# Patient Record
Sex: Female | Born: 1950 | Race: White | Hispanic: No | Marital: Married | Smoking: Never smoker
Health system: Southern US, Community
[De-identification: ages and names within clinical notes are randomized; demographics above are authoritative.]

## PROBLEM LIST (undated history)

## (undated) DIAGNOSIS — M1990 Unspecified osteoarthritis, unspecified site: Secondary | ICD-10-CM

## (undated) DIAGNOSIS — K219 Gastro-esophageal reflux disease without esophagitis: Secondary | ICD-10-CM

## (undated) DIAGNOSIS — Z87442 Personal history of urinary calculi: Secondary | ICD-10-CM

## (undated) DIAGNOSIS — R51 Headache: Secondary | ICD-10-CM

## (undated) HISTORY — PX: ABDOMINAL HYSTERECTOMY: SHX81

## (undated) HISTORY — DX: Personal history of urinary calculi: Z87.442

## (undated) HISTORY — DX: Gastro-esophageal reflux disease without esophagitis: K21.9

## (undated) HISTORY — DX: Headache: R51

## (undated) HISTORY — DX: Unspecified osteoarthritis, unspecified site: M19.90

---

## 2003-05-22 ENCOUNTER — Other Ambulatory Visit: Admission: RE | Admit: 2003-05-22 | Discharge: 2003-05-22 | Payer: Self-pay | Admitting: Obstetrics and Gynecology

## 2003-06-06 ENCOUNTER — Encounter: Admission: RE | Admit: 2003-06-06 | Discharge: 2003-06-06 | Payer: Self-pay | Admitting: Obstetrics and Gynecology

## 2003-06-06 ENCOUNTER — Encounter: Payer: Self-pay | Admitting: Obstetrics and Gynecology

## 2006-07-23 ENCOUNTER — Ambulatory Visit: Payer: Self-pay | Admitting: Internal Medicine

## 2006-08-09 ENCOUNTER — Encounter (INDEPENDENT_AMBULATORY_CARE_PROVIDER_SITE_OTHER): Payer: Self-pay | Admitting: *Deleted

## 2006-08-09 ENCOUNTER — Ambulatory Visit (HOSPITAL_COMMUNITY): Admission: RE | Admit: 2006-08-09 | Discharge: 2006-08-09 | Payer: Self-pay | Admitting: Internal Medicine

## 2006-08-13 ENCOUNTER — Ambulatory Visit: Payer: Self-pay | Admitting: Internal Medicine

## 2006-09-14 ENCOUNTER — Ambulatory Visit: Payer: Self-pay | Admitting: Internal Medicine

## 2006-09-14 LAB — HM COLONOSCOPY: HM Colonoscopy: NORMAL

## 2009-02-25 LAB — HM MAMMOGRAPHY: HM Mammogram: NORMAL

## 2010-08-27 ENCOUNTER — Other Ambulatory Visit: Admission: RE | Admit: 2010-08-27 | Discharge: 2010-08-27 | Payer: Self-pay | Admitting: Internal Medicine

## 2010-08-27 ENCOUNTER — Ambulatory Visit: Payer: Self-pay | Admitting: Internal Medicine

## 2010-08-27 DIAGNOSIS — Z87442 Personal history of urinary calculi: Secondary | ICD-10-CM

## 2010-08-27 DIAGNOSIS — K219 Gastro-esophageal reflux disease without esophagitis: Secondary | ICD-10-CM

## 2010-08-27 DIAGNOSIS — M1990 Unspecified osteoarthritis, unspecified site: Secondary | ICD-10-CM | POA: Insufficient documentation

## 2010-08-27 DIAGNOSIS — R51 Headache: Secondary | ICD-10-CM

## 2010-08-27 DIAGNOSIS — R519 Headache, unspecified: Secondary | ICD-10-CM | POA: Insufficient documentation

## 2010-08-27 HISTORY — DX: Personal history of urinary calculi: Z87.442

## 2010-08-27 HISTORY — DX: Gastro-esophageal reflux disease without esophagitis: K21.9

## 2010-08-27 HISTORY — DX: Headache: R51

## 2010-08-27 HISTORY — DX: Unspecified osteoarthritis, unspecified site: M19.90

## 2010-08-27 LAB — CONVERTED CEMR LAB
AST: 23 units/L (ref 0–37)
Basophils Absolute: 0 10*3/uL (ref 0.0–0.1)
Bilirubin, Direct: 0.2 mg/dL (ref 0.0–0.3)
CO2: 32 meq/L (ref 19–32)
Chloride: 106 meq/L (ref 96–112)
Cholesterol: 284 mg/dL — ABNORMAL HIGH (ref 0–200)
Creatinine, Ser: 0.9 mg/dL (ref 0.4–1.2)
Eosinophils Relative: 4 % (ref 0.0–5.0)
Glucose, Bld: 81 mg/dL (ref 70–99)
HCT: 41.4 % (ref 36.0–46.0)
HDL: 48.1 mg/dL (ref >39.00)
Lymphs Abs: 1.7 10*3/uL (ref 0.7–4.0)
Monocytes Relative: 8.1 % (ref 3.0–12.0)
Neutrophils Relative %: 55.1 % (ref 43.0–77.0)
Pap Smear: NEGATIVE
Platelets: 179 10*3/uL (ref 150.0–400.0)
RDW: 14.7 % — ABNORMAL HIGH (ref 11.5–14.6)
Sodium: 143 meq/L (ref 135–145)
TSH: 2.07 microintl units/mL (ref 0.35–5.50)
Total Bilirubin: 0.8 mg/dL (ref 0.3–1.2)
Total CHOL/HDL Ratio: 6
Triglycerides: 117 mg/dL (ref 0.0–149.0)
VLDL: 23.4 mg/dL (ref 0.0–40.0)
WBC: 5.3 10*3/uL (ref 4.5–10.5)

## 2010-08-29 ENCOUNTER — Encounter: Payer: Self-pay | Admitting: Internal Medicine

## 2011-01-13 NOTE — Letter (Signed)
Summary: Results Follow-up Letter  [facility]  [address] [place], [place] [address]   Phone: [phone]  Fax: [phone]    08/29/2010  [address] [place], [place]  [address]  Dear Ms. Mayford Knife,   The following are the results of your recent test(s):  Test     Result     Pap Smear    Normal____XX___  Not Normal_____       Comments:none    _________________________________________________________  Please call for an appointment as directed _________________________________________________________ _________________________________________________________ _________________________________________________________  Sincerely,  Sanda Linger MD [facility]

## 2011-01-13 NOTE — Assessment & Plan Note (Signed)
Summary: NEW/ [facility] /NWS  #   Vital Signs:  Patient profile:   60 year old female Menstrual status:  hysterectomy Height:      66 inches Weight:      172.75 pounds BMI:     27.98 O2 Sat:      95 % on Room air Temp:     98.3 degrees F oral Pulse rate:   64 / minute Pulse rhythm:   regular Resp:     16 per minute BP sitting:   128 / 80  (left arm) Cuff size:   large  Vitals Entered By: Rock Nephew CMA (August 27, 2010 9:04 AM)  Nutrition Counseling: Patient's BMI is greater than 25 and therefore counseled on weight management options.  O2 Flow:  Room air CC: New to establish/ pt c/o  acid reflux and heartburn, Preventive Care, Heartburn Is Patient Diabetic? No Pain Assessment Patient in pain? no       Does patient need assistance? Functional Status Self care Ambulation Normal     Menstrual Status hysterectomy   Primary Care Provider:  Etta Grandchild MD  CC:  New to establish/ pt c/o  acid reflux and heartburn, Preventive Care, and Heartburn.  History of Present Illness:  Heartburn      This is a 60 year old woman who presents with Heartburn.  The symptoms began >1 year ago.  The intensity is described as moderate.  The patient reports acid reflux and weight gain, but denies sour taste in mouth, epigastric pain, chest pain, trouble swallowing, and weight loss.  The patient denies the following alarm features: melena, dysphagia, hematemesis, vomiting, involuntary weight loss >5%, and history of anemia.  Symptoms are worse with spicy foods.  Prior evaluation has included no diagnostic studies.  Treatment that was tried and either found to be ineffective or stopped due to problems include dietary changes, weight loss, elevating the head of bed, an antacid, and an H2 blocker.    Preventive Screening-Counseling & Management  Alcohol-Tobacco     Alcohol drinks/day: 0     Smoking Status: never     Tobacco Counseling: not indicated; no tobacco  use  Caffeine-Diet-Exercise     Does Patient Exercise: yes     Type of exercise: walking     Exercise (avg: min/session): <30     Times/week: 4     Exercise Counseling: to improve exercise regimen  Hep-HIV-STD-Contraception     Hepatitis Risk: no risk noted     HIV Risk: no risk noted     STD Risk: no risk noted     Dental Visit-last 6 months yes     Dental Care Counseling: to seek dental care; no dental care within six months     SBE monthly: yes     SBE Education/Counseling: to perform regular SBE     Sun Exposure-Excessive: no  Safety-Violence-Falls     Seat Belt Use: yes     Helmet Use: yes     Firearms in the Home: no firearms in the home     Smoke Detectors: yes     Sexual Abuse: no      Sexual History:  currently monogamous.        Drug Use:  never and no.        Blood Transfusions:  no.    Medications Prior to Update: 1)  None  Current Medications (verified): 1)  Omeprazole 40 Mg Cpdr (Omeprazole) .... One By Mouth Once Daily  Allergies (  verified): No Known Drug Allergies  Comments:  Nurse/Medical Assistant: The patient's medications and allergies were reviewed with the patient and were updated in the Medication and Allergy Lists. Rock Nephew CMA (August 27, 2010 9:04 AM)  Past History:  Past Medical History: Headache Nephrolithiasis, hx of Osteoarthritis  Past Surgical History: Hysterectomy  Family History: Family History of Alcoholism/Addiction Family History of Arthritis Family History Diabetes 1st degree relative Family History High cholesterol Family History Hypertension Family History Colon Cancer Family History Heart disease  Social History: Occupation: Geographical information systems officer Married Never Smoked Alcohol use-no Drug use-no Regular exercise-yes Smoking Status:  never Drug Use:  never, no Does Patient Exercise:  yes 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  The patient denies anorexia, fever, weight loss, hoarseness, chest pain, syncope, dyspnea on exertion, peripheral edema, prolonged cough, headaches, hemoptysis, abdominal pain, melena, hematochezia, hematuria, incontinence, genital sores, muscle weakness, suspicious skin lesions, difficulty walking, depression, abnormal bleeding, enlarged lymph nodes, angioedema, and breast masses.   GI:  Complains of indigestion; denies abdominal pain, bloody stools, change in bowel habits, constipation, dark tarry stools, diarrhea, gas, loss of appetite, nausea, vomiting, vomiting blood, and yellowish skin color.  Physical Exam  General:  alert, well-developed, well-nourished, well-hydrated, appropriate dress, normal appearance, healthy-appearing, cooperative to examination, good hygiene, and overweight-appearing.   Head:  normocephalic, atraumatic, no abnormalities observed, and no abnormalities palpated.   Eyes:  vision grossly intact, pupils equal, pupils round, and pupils reactive to light.   Mouth:  Oral mucosa and oropharynx without lesions or exudates.  Teeth in good repair. Neck:  supple, full ROM, no masses, no thyromegaly, no thyroid nodules or tenderness, no JVD, normal carotid upstroke, no carotid bruits, no cervical lymphadenopathy, and no neck tenderness.   Chest Wall:  No deformities, masses, or tenderness noted. Breasts:  skin/areolae normal, no masses, no abnormal thickening, no nipple discharge, no tenderness, and no adenopathy.   Lungs:  normal respiratory effort, no intercostal retractions, no accessory muscle use, normal breath sounds, no dullness, no fremitus, no crackles, and no wheezes.   Heart:  normal rate, regular  rhythm, no murmur, no gallop, no rub, and no JVD.   Abdomen:  soft, non-tender, normal bowel sounds, no distention, no masses, no guarding, no rigidity, no rebound tenderness, no abdominal hernia, no inguinal hernia, no hepatomegaly, and no splenomegaly.   Rectal:  No external abnormalities noted. Normal sphincter tone. No rectal masses or tenderness. Genitalia:  normal introitus, no external lesions, no vaginal discharge, mucosa pink and moist, no vaginal or cervical lesions, no vaginal atrophy, no friaility or hemorrhage, and no adnexal masses or tenderness.   Msk:  No deformity or scoliosis noted of thoracic or lumbar spine.   Pulses:  R and L carotid,radial,femoral,dorsalis pedis and posterior tibial pulses are full and equal bilaterally Extremities:  No clubbing, cyanosis, edema, or deformity noted with normal full range of motion of all joints.   Neurologic:  No cranial nerve deficits noted. Station and gait are normal. Plantar reflexes are down-going bilaterally. DTRs are symmetrical throughout. Sensory, motor and coordinative functions appear intact. Skin:  turgor normal, color  normal, no rashes, no suspicious lesions, no ecchymoses, no petechiae, no purpura, no ulcerations, no edema, actinic keratosis, and seborrheic keratosis.   Cervical Nodes:  no anterior cervical adenopathy and no posterior cervical adenopathy.   Axillary Nodes:  no R axillary adenopathy and no L axillary adenopathy.   Inguinal Nodes:  no R inguinal adenopathy and no L inguinal adenopathy.   Psych:  Cognition and judgment appear intact. Alert and cooperative with normal attention span and concentration. No apparent delusions, illusions, hallucinations   Impression & Recommendations:  Problem # 1:  ROUTINE GENERAL MEDICAL EXAM@HEALTH  CARE FACL (ICD-V70.0) Assessment New  Orders: Venipuncture ([address]) TLB-Lipid Panel (80061-LIPID) TLB-CBC Platelet - w/Differential (85025-CBCD) TLB-Hepatic/Liver Function Pnl  (80076-HEPATIC) TLB-TSH (Thyroid Stimulating Hormone) (84443-TSH) TLB-BMP (Basic Metabolic Panel-BMET) (80048-METABOL) Radiology Referral (Radiology) Hemoccult Guaiac-1 spec.(in office) ([address])  Mammogram: Normal Bilateral (02/25/2009) Colonoscopy: Normal (09/14/2006)  Discussed using sunscreen, use of alcohol, drug use, self breast exam, routine dental care, routine eye care, schedule for GYN exam, routine physical exam, seat belts, multiple vitamins, osteoporosis prevention, adequate calcium intake in diet, recommendations for immunizations, mammograms and Pap smears.  Discussed exercise and checking cholesterol.   Problem # 2:  GERD (ICD-530.81) Assessment: New  Her updated medication list for this problem includes:    Omeprazole 40 Mg Cpdr (Omeprazole) ..... One by mouth once daily  Orders: Venipuncture ([address]) TLB-Lipid Panel (80061-LIPID) TLB-CBC Platelet - w/Differential (85025-CBCD) TLB-Hepatic/Liver Function Pnl (80076-HEPATIC) TLB-TSH (Thyroid Stimulating Hormone) (84443-TSH) TLB-BMP (Basic Metabolic Panel-BMET) (80048-METABOL) Hemoccult Guaiac-1 spec.(in office) (82270)  Complete Medication List: 1)  Omeprazole 40 Mg Cpdr (Omeprazole) .... One by mouth once daily  Other Orders: Tdap => 107yrs IM (91478) Admin 1st Vaccine ([address]) Specimen Handling ([address])  Colorectal Screening:  Current Recommendations:    Hemoccult: NEG X 1 today  Colonoscopy Results:    Date of Exam: 09/14/2006    Results: Normal  PAP Screening:    Hx Cervical Dysplasia in last 5 yrs? No    3 normal PAP smears in last 5 yrs? Yes    Reviewed PAP smear recommendations:  PAP smear done  Mammogram Screening:    Last Mammogram:  02/25/2009    Reviewed Mammogram recommendations:  mammogram ordered  Mammogram Results:    Date of Exam:  02/25/2009    Results:  Normal Bilateral  Osteoporosis Risk Assessment:  Risk Factors for Fracture or Low Bone Density:   Race (White or Asian):     yes    Smoking status:       never  Immunization & Chemoprophylaxis:    Tetanus vaccine: Tdap  (08/27/2010)  Patient Instructions: 1)  Please schedule a follow-up appointment in 3 months. 2)  Avoid foods high in acid (tomatoes, citrus juices, spicy foods). Avoid eating within two hours of lying down or before exercising. Do not over eat; try smaller more frequent meals. Elevate head of bed twelve inches when sleeping. 3)  It is important that you exercise regularly at least 20 minutes 5 times a week. If you develop chest pain, have severe difficulty breathing, or feel very tired , stop exercising immediately and seek medical attention. 4)  You need to lose weight. Consider a lower calorie diet and regular exercise.  5)  Schedule your mammogram. Prescriptions: OMEPRAZOLE 40 MG CPDR (OMEPRAZOLE) One by mouth once daily  #30 x 11   Entered and Authorized by:   Etta Grandchild MD   Signed by:   Etta Grandchild MD on 08/27/2010   Method used:  Print then Give to Patient   RxID:   [id number]     Immunizations Administered:  Tetanus Vaccine:    Vaccine Type: Tdap    Site: right deltoid    Mfr: GlaxoSmithKline    Dose: 0.5 ml    Route: IM    Given by: Rock Nephew CMA    Exp. Date: 09/03/2012    Lot #: [id number]    VIS given: 10/31/08 version given August 27, 2010.

## 2011-01-13 NOTE — Letter (Signed)
Summary: Lipid Letter  [facility]  [address] [place], [place] [address]   Phone: [phone]  Fax: [phone]    08/27/2010  Kristena Wilhelmi [address] [place], [place]  [address]  Dear Ms. Selders:  We have carefully reviewed your last lipid profile from  and the results are noted below with a summary of recommendations for lipid management.    Cholesterol:       284     Goal: <200   HDL "good" Cholesterol:   06.30     Goal: >50   LDL "bad" Cholesterol:   213     Goal: <130 WOW- this is high   Triglycerides:       117.0     Goal: <150        TLC Diet (Therapeutic Lifestyle Change): Saturated Fats & Transfatty acids should be kept < 7% of total calories ***Reduce Saturated Fats Polyunstaurated Fat can be up to 10% of total calories Monounsaturated Fat Fat can be up to 20% of total calories Total Fat should be no greater than 25-35% of total calories Carbohydrates should be 50-60% of total calories Protein should be approximately 15% of total calories Fiber should be at least 20-30 grams a day ***Increased fiber may help lower LDL Total Cholesterol should be < 200mg /day Consider adding plant stanol/sterols to diet (example: Benacol spread) ***A higher intake of unsaturated fat may reduce Triglycerides and Increase HDL    Adjunctive Measures (may lower LIPIDS and reduce risk of Heart Attack) include: Aerobic Exercise (20-30 minutes 3-4 times a week) Limit Alcohol Consumption Weight Reduction Aspirin 75-81 mg a day by mouth (if not allergic or contraindicated) Dietary Fiber 20-30 grams a day by mouth     Current Medications: 1)    Omeprazole 40 Mg Cpdr (Omeprazole) .... One by mouth once daily  If you have any questions, please call. We appreciate being able to work with you.   Sincerely,    [facility] Etta Grandchild MD

## 2011-05-01 NOTE — Assessment & Plan Note (Signed)
[facility]                           GASTROENTEROLOGY OFFICE NOTE   NAME:WILLIAMSConcepcion, Gillott                      MRN:          [id number]  DATE:09/14/2006                            DOB:          [birth year]    Ms. Lehtinen returns for followup.  She had some sort of illness where she  had a severe diarrhea and cramps and a thickened ileum.  A colonoscopy was  normal and she is now well.  I had her come back just to make sure things  were okay.  There does not seem to be any post infectious irritable bowel  syndrome.  Her terminal ileum biopsies were negative.  At this point, I  assumed she had some sort of infectious enteritis problem that has resolved.  She will see me as needed.       Iva Boop, MD,FACG      CEG/MedQ  DD:  09/14/2006  DT:  09/15/2006  Job #:  [id number]   cc:   Jonita Albee, M.D.

## 2011-05-01 NOTE — Assessment & Plan Note (Signed)
[facility]                           GASTROENTEROLOGY OFFICE NOTE   NAME:WILLIAMSKalisha, Keadle                      MRN:          [id number]  DATE:07/23/2006                            DOB:          12/30/1950    REQUESTING PHYSICIAN:  Dr. Robert Bellow.   REASON FOR CONSULTATION:  Abnormal CT scan with abdominal pain and diarrhea,  question regional enteritis.   ASSESSMENT:  This is a 60 year old white woman with a thickened distal ilium  on CT scan in the setting of acute abdominal pain and diarrhea.  She is  actually much improved at this point, the symptoms started approximately  five days ago.  Crohn's disease or regional enteritis is definitely in the  differential, but given the acute onset and rapid recovery, I am more  suspicious of an acute infectious enteritis.  However, she did have a  similar but much more limited spell in July, so chronic problems like  Crohn's disease or perhaps recurrent ischemia of the intestines are  certainly possible and will need to be concerned.   PLAN:  1. Schedule colonoscopy.  2. If the colonoscopy is unremarkable, would observe, need to review the      films further as well, consider whether she needs a CT angiogram.  3. If she does have signs of Crohn's disease would probably re-institute      Asacol (I have asked her to stop it) versus other therapy, she should      continue her courses of Cipro and Flagyl at this time.  4. Risks, benefits, and indications of colonoscopy have been explained to      the patient.  She understands and agrees.   HISTORY:  This is a 60 year old white woman with problems as above.  Nobody  else has been sick.  There has been no foreign travel.  She did have a  similar spell in July only lasted for 2 days.  She is much better.  At this  time.  She did have a fever when this started as well.  That has abated  also.  Laboratory investigation is pending as far as stool studies  which  were collected at [facility].  I do have a CMET,  amylase, and lipase which were normal.  She did have a total cholesterol of  262.   MEDICATIONS:  1. Ciprofloxacin 500 mg b.i.d.  2. Hydrocodone/APAP q.4 hours as needed (not really using).  3. Metronidazole 500 mg b.i.d.  4. Asacol 400 mg b.i.d.  5. Motrin and Tums are used as needed.   ALLERGIES:  There are no known drug allergies.   PAST MEDICAL HISTORY:  1. Nephrolithiasis 1998.  2. Hysterectomy 1991.   FAMILY HISTORY:  Positive for diabetes, alcoholism.  No colon cancer or  inflammatory bowel disease.   SOCIAL HISTORY:  She is married.  She is an Nature conservation officer at [employer].  Two sons, one daughter.  Two of those are still at home.  Occasional  alcohol.  No drugs.   REVIEW OF SYSTEMS:  As above.  Some occasional night  sweats.  Some tinnitus  in the past.  Eyeglasses are used.  All other symptoms negative.   PHYSICAL EXAMINATION:  GENERAL:  This is a well-developed, well-nourished,  middle-aged white woman in no acute distress.  VITALS:  Height 5 feet 5 inches.  Weight 156 pounds.  Blood pressure 124/84.  Pulse 60.  EYES:  Anicteric.  __________  .  NECK:  Supple, no masses.  CHEST:  Clear.  HEART:  S1, S2. No murmurs or gallops.  ABDOMEN:  Mildly tender in the right lower quadrant with deep palpation.  I  detect no mass or organomegaly or other problems.  EXTREMITIES:  No edema.  SKIN:  No rash.  NEURO:  She is alert and oriented x3.   I appreciate the opportunity to care for this patient.                                   Iva Boop, MD, Clementeen Graham   CEG/MedQ  DD:  07/24/2006  DT:  07/24/2006  Job #:  [id number]   cc:   Jonita Albee, MD  Juluis Mire, MD

## 2011-09-20 ENCOUNTER — Other Ambulatory Visit: Payer: Self-pay | Admitting: Internal Medicine

## 2011-11-26 ENCOUNTER — Encounter: Payer: Self-pay | Admitting: Internal Medicine

## 2011-11-26 ENCOUNTER — Other Ambulatory Visit (INDEPENDENT_AMBULATORY_CARE_PROVIDER_SITE_OTHER): Payer: 59

## 2011-11-26 ENCOUNTER — Other Ambulatory Visit: Payer: Self-pay | Admitting: Internal Medicine

## 2011-11-26 ENCOUNTER — Ambulatory Visit (INDEPENDENT_AMBULATORY_CARE_PROVIDER_SITE_OTHER): Payer: 59 | Admitting: Internal Medicine

## 2011-11-26 VITALS — BP 136/80 | HR 66 | Temp 97.1°F | Resp 16 | Wt 165.0 lb

## 2011-11-26 DIAGNOSIS — Z23 Encounter for immunization: Secondary | ICD-10-CM

## 2011-11-26 DIAGNOSIS — K219 Gastro-esophageal reflux disease without esophagitis: Secondary | ICD-10-CM

## 2011-11-26 DIAGNOSIS — Z0000 Encounter for general adult medical examination without abnormal findings: Secondary | ICD-10-CM

## 2011-11-26 LAB — CBC WITH DIFFERENTIAL/PLATELET
Basophils Absolute: 0 10*3/uL (ref 0.0–0.1)
Basophils Relative: 0.6 % (ref 0.0–3.0)
Eosinophils Relative: 1.6 % (ref 0.0–5.0)
Hemoglobin: 13.3 g/dL (ref 12.0–15.0)
Lymphocytes Relative: 32.3 % (ref 12.0–46.0)
Monocytes Relative: 10 % (ref 3.0–12.0)
Neutro Abs: 3.1 10*3/uL (ref 1.4–7.7)
RBC: 4.79 Mil/uL (ref 3.87–5.11)
RDW: 14.6 % (ref 11.5–14.6)
WBC: 5.5 10*3/uL (ref 4.5–10.5)

## 2011-11-26 LAB — TSH: TSH: 2.32 u[IU]/mL (ref 0.35–5.50)

## 2011-11-26 LAB — COMPREHENSIVE METABOLIC PANEL
ALT: 19 U/L (ref 0–35)
CO2: 29 mEq/L (ref 19–32)
Calcium: 9.7 mg/dL (ref 8.4–10.5)
Chloride: 106 mEq/L (ref 96–112)
Creatinine, Ser: 0.9 mg/dL (ref 0.4–1.2)
GFR: 65.08 mL/min (ref >60.00)
Glucose, Bld: 86 mg/dL (ref 70–99)

## 2011-11-26 LAB — LIPID PANEL
Total CHOL/HDL Ratio: 5
VLDL: 23.4 mg/dL (ref 0.0–40.0)

## 2011-11-26 LAB — LDL CHOLESTEROL, DIRECT: Direct LDL: 189.8 mg/dL

## 2011-11-26 LAB — URINALYSIS, ROUTINE W REFLEX MICROSCOPIC
Hgb urine dipstick: NEGATIVE
Ketones, ur: NEGATIVE
Urine Glucose: NEGATIVE
Urobilinogen, UA: 0.2 (ref 0.0–1.0)

## 2011-11-26 MED ORDER — OMEPRAZOLE 40 MG PO CPDR: 40.0000 mg | DELAYED_RELEASE_CAPSULE | Freq: Every day | ORAL | Status: DC

## 2011-11-26 NOTE — Patient Instructions (Signed)

## 2011-11-26 NOTE — Progress Notes (Signed)
  Subjective:    Patient ID: Julia Olson, female    DOB: Jan 29, 1951, 60 y.o.   MRN: [id number]  HPI  New to me for a complete physical, she offers no complaints.  Review of Systems  Constitutional: Negative.   HENT: Negative.   Eyes: Negative.   Respiratory: Negative.   Cardiovascular: Negative.   Gastrointestinal: Negative.   Genitourinary: Negative.   Musculoskeletal: Negative.   Skin: Negative.   Neurological: Negative.   Hematological: Negative.   Psychiatric/Behavioral: Negative.        Objective:   Physical Exam  Vitals reviewed. Constitutional: She is oriented to person, place, and time. She appears well-developed and well-nourished. No distress.  HENT:  Head: Normocephalic and atraumatic.  Mouth/Throat: Oropharynx is clear and moist. No oropharyngeal exudate.  Eyes: Conjunctivae are normal. Right eye exhibits no discharge. Left eye exhibits no discharge. No scleral icterus.  Neck: Normal range of motion. Neck supple. No JVD present. No tracheal deviation present. No thyromegaly present.  Cardiovascular: Normal rate, regular rhythm, normal heart sounds and intact distal pulses.  Exam reveals no gallop and no friction rub.   No murmur heard. Pulmonary/Chest: Effort normal and breath sounds normal. No stridor. No respiratory distress. She has no wheezes. She has no rales. Chest wall is not dull to percussion. She exhibits no mass, no tenderness, no bony tenderness, no laceration, no crepitus, no edema, no deformity, no swelling and no retraction. Right breast exhibits no inverted nipple, no mass, no nipple discharge, no skin change and no tenderness. Left breast exhibits no inverted nipple, no mass, no nipple discharge, no skin change and no tenderness. Breasts are symmetrical.  Abdominal: Soft. Bowel sounds are normal. She exhibits no distension. There is no tenderness. There is no rebound and no guarding.  Musculoskeletal: Normal range of motion. She exhibits no edema and  no tenderness.  Lymphadenopathy:    She has no cervical adenopathy.  Neurological: She is oriented to person, place, and time.  Skin: Skin is warm and dry. No rash noted. She is not diaphoretic. No erythema. No pallor.  Psychiatric: She has a normal mood and affect. Her behavior is normal. Judgment and thought content normal.      Lab Results  Component Value Date   WBC 5.3 08/27/2010   HGB 14.1 08/27/2010   HCT 41.4 08/27/2010   PLT 179.0 08/27/2010   GLUCOSE 81 08/27/2010   CHOL 284* 08/27/2010   TRIG 117.0 08/27/2010   HDL 48.10 08/27/2010   LDLDIRECT 212.5 08/27/2010   ALT 20 08/27/2010   AST 23 08/27/2010   NA 143 08/27/2010   K 4.9 08/27/2010   CL 106 08/27/2010   CREATININE 0.9 08/27/2010   BUN 14 08/27/2010   CO2 32 08/27/2010   TSH 2.07 08/27/2010      Assessment & Plan:

## 2011-11-26 NOTE — Assessment & Plan Note (Signed)
Exam done, vaccines updated, labs ordered, pt ed material was given 

## 2012-10-14 ENCOUNTER — Telehealth: Payer: Self-pay

## 2012-10-14 MED ORDER — OMEPRAZOLE 20 MG PO CPDR: 40.0000 mg | DELAYED_RELEASE_CAPSULE | Freq: Every day | ORAL | Status: DC

## 2012-10-14 NOTE — Telephone Encounter (Signed)
Patient called [facility] stating that omperazole 40 mg has increased from 10$ to 100$. SHe is requesting RX for the 20mg  instead to be sent to [facility] .

## 2013-02-07 ENCOUNTER — Other Ambulatory Visit: Payer: Self-pay | Admitting: Internal Medicine

## 2014-02-18 ENCOUNTER — Other Ambulatory Visit: Payer: Self-pay | Admitting: Internal Medicine

## 2015-03-05 ENCOUNTER — Telehealth: Payer: Self-pay | Admitting: Internal Medicine

## 2015-03-05 NOTE — Telephone Encounter (Signed)
yes

## 2015-03-05 NOTE — Telephone Encounter (Signed)
Patient is requesting to reestablish

## 2015-03-07 NOTE — Telephone Encounter (Signed)
Got appointment scheduled

## 2015-03-15 ENCOUNTER — Encounter: Payer: Self-pay | Admitting: Internal Medicine

## 2015-03-15 ENCOUNTER — Other Ambulatory Visit (INDEPENDENT_AMBULATORY_CARE_PROVIDER_SITE_OTHER): Payer: 59

## 2015-03-15 ENCOUNTER — Ambulatory Visit (INDEPENDENT_AMBULATORY_CARE_PROVIDER_SITE_OTHER): Payer: 59 | Admitting: Internal Medicine

## 2015-03-15 VITALS — BP 142/82 | HR 64 | Temp 98.1°F | Resp 16 | Ht 65.0 in | Wt 151.0 lb

## 2015-03-15 DIAGNOSIS — Z0000 Encounter for general adult medical examination without abnormal findings: Secondary | ICD-10-CM

## 2015-03-15 DIAGNOSIS — K219 Gastro-esophageal reflux disease without esophagitis: Secondary | ICD-10-CM

## 2015-03-15 DIAGNOSIS — Z1231 Encounter for screening mammogram for malignant neoplasm of breast: Secondary | ICD-10-CM

## 2015-03-15 LAB — COMPREHENSIVE METABOLIC PANEL
ALBUMIN: 4.2 g/dL (ref 3.5–5.2)
ALT: 17 U/L (ref 0–35)
AST: 20 U/L (ref 0–37)
Alkaline Phosphatase: 65 U/L (ref 39–117)
BUN: 12 mg/dL (ref 6–23)
CO2: 31 meq/L (ref 19–32)
Calcium: 9.7 mg/dL (ref 8.4–10.5)
Chloride: 103 mEq/L (ref 96–112)
Creatinine, Ser: 0.79 mg/dL (ref 0.40–1.20)
GFR: 77.73 mL/min (ref >60.00)
GLUCOSE: 80 mg/dL (ref 70–99)
POTASSIUM: 3.9 meq/L (ref 3.5–5.1)
SODIUM: 139 meq/L (ref 135–145)
TOTAL PROTEIN: 7 g/dL (ref 6.0–8.3)
Total Bilirubin: 0.4 mg/dL (ref 0.2–1.2)

## 2015-03-15 LAB — LIPID PANEL
CHOLESTEROL: 214 mg/dL — AB (ref 0–200)
HDL: 54.3 mg/dL (ref >39.00)
LDL Cholesterol: 142 mg/dL — ABNORMAL HIGH (ref 0–99)
NonHDL: 159.7
Total CHOL/HDL Ratio: 4
Triglycerides: 91 mg/dL (ref 0.0–149.0)
VLDL: 18.2 mg/dL (ref 0.0–40.0)

## 2015-03-15 LAB — CBC WITH DIFFERENTIAL/PLATELET
BASOS ABS: 0 10*3/uL (ref 0.0–0.1)
Basophils Relative: 0.5 % (ref 0.0–3.0)
Eosinophils Absolute: 0.2 10*3/uL (ref 0.0–0.7)
Eosinophils Relative: 2.4 % (ref 0.0–5.0)
HCT: 35.3 % — ABNORMAL LOW (ref 36.0–46.0)
Hemoglobin: 11.2 g/dL — ABNORMAL LOW (ref 12.0–15.0)
Lymphocytes Relative: 23.5 % (ref 12.0–46.0)
Lymphs Abs: 1.9 10*3/uL (ref 0.7–4.0)
MCHC: 31.6 g/dL (ref 30.0–36.0)
MCV: 70.6 fl — ABNORMAL LOW (ref 78.0–100.0)
MONOS PCT: 7.2 % (ref 3.0–12.0)
Monocytes Absolute: 0.6 10*3/uL (ref 0.1–1.0)
NEUTROS ABS: 5.3 10*3/uL (ref 1.4–7.7)
NEUTROS PCT: 66.4 % (ref 43.0–77.0)
Platelets: 226 10*3/uL (ref 150.0–400.0)
RBC: 5.01 Mil/uL (ref 3.87–5.11)
RDW: 17.1 % — ABNORMAL HIGH (ref 11.5–15.5)
WBC: 8 10*3/uL (ref 4.0–10.5)

## 2015-03-15 LAB — TSH: TSH: 2.21 u[IU]/mL (ref 0.35–4.50)

## 2015-03-15 MED ORDER — OMEPRAZOLE 20 MG PO CPDR: 40.0000 mg | DELAYED_RELEASE_CAPSULE | Freq: Every day | ORAL | Status: DC

## 2015-03-15 NOTE — Patient Instructions (Signed)
Preventive Care for Adults A healthy lifestyle and preventive care can promote health and wellness. Preventive health guidelines for women include the following key practices.  A routine yearly physical is a good way to check with your health care provider about your health and preventive screening. It is a chance to share any concerns and updates on your health and to receive a thorough exam.  Visit your dentist for a routine exam and preventive care every 6 months. Brush your teeth twice a day and floss once a day. Good oral hygiene prevents tooth decay and gum disease.  The frequency of eye exams is based on your age, health, family medical history, use of contact lenses, and other factors. Follow your health care provider's recommendations for frequency of eye exams.  Eat a healthy diet. Foods like vegetables, fruits, whole grains, low-fat dairy products, and lean protein foods contain the nutrients you need without too many calories. Decrease your intake of foods high in solid fats, added sugars, and salt. Eat the right amount of calories for you.Get information about a proper diet from your health care provider, if necessary.  Regular physical exercise is one of the most important things you can do for your health. Most adults should get at least 150 minutes of moderate-intensity exercise (any activity that increases your heart rate and causes you to sweat) each week. In addition, most adults need muscle-strengthening exercises on 2 or more days a week.  Maintain a healthy weight. The body mass index (BMI) is a screening tool to identify possible weight problems. It provides an estimate of body fat based on height and weight. Your health care provider can find your BMI and can help you achieve or maintain a healthy weight.For adults 20 years and older:  A BMI below 18.5 is considered underweight.  A BMI of 18.5 to 24.9 is normal.  A BMI of 25 to 29.9 is considered overweight.  A BMI of  30 and above is considered obese.  Maintain normal blood lipids and cholesterol levels by exercising and minimizing your intake of saturated fat. Eat a balanced diet with plenty of fruit and vegetables. Blood tests for lipids and cholesterol should begin at age 76 and be repeated every 5 years. If your lipid or cholesterol levels are high, you are over 50, or you are at high risk for heart disease, you may need your cholesterol levels checked more frequently.Ongoing high lipid and cholesterol levels should be treated with medicines if diet and exercise are not working.  If you smoke, find out from your health care provider how to quit. If you do not use tobacco, do not start.  Lung cancer screening is recommended for adults aged 22-80 years who are at high risk for developing lung cancer because of a history of smoking. A yearly low-dose CT scan of the lungs is recommended for people who have at least a 30-pack-year history of smoking and are a current smoker or have quit within the past 15 years. A pack year of smoking is smoking an average of 1 pack of cigarettes a day for 1 year (for example: 1 pack a day for 30 years or 2 packs a day for 15 years). Yearly screening should continue until the smoker has stopped smoking for at least 15 years. Yearly screening should be stopped for people who develop a health problem that would prevent them from having lung cancer treatment.  If you are pregnant, do not drink alcohol. If you are breastfeeding,  be very cautious about drinking alcohol. If you are not pregnant and choose to drink alcohol, do not have more than 1 drink per day. One drink is considered to be 12 ounces (355 mL) of beer, 5 ounces (148 mL) of wine, or 1.5 ounces (44 mL) of liquor.  Avoid use of street drugs. Do not share needles with anyone. Ask for help if you need support or instructions about stopping the use of drugs.  High blood pressure causes heart disease and increases the risk of  stroke. Your blood pressure should be checked at least every 1 to 2 years. Ongoing high blood pressure should be treated with medicines if weight loss and exercise do not work.  If you are 75-52 years old, ask your health care provider if you should take aspirin to prevent strokes.  Diabetes screening involves taking a blood sample to check your fasting blood sugar level. This should be done once every 3 years, after age 15, if you are within normal weight and without risk factors for diabetes. Testing should be considered at a younger age or be carried out more frequently if you are overweight and have at least 1 risk factor for diabetes.  Breast cancer screening is essential preventive care for women. You should practice "breast self-awareness." This means understanding the normal appearance and feel of your breasts and may include breast self-examination. Any changes detected, no matter how small, should be reported to a health care provider. Women in their 58s and 30s should have a clinical breast exam (CBE) by a health care provider as part of a regular health exam every 1 to 3 years. After age 16, women should have a CBE every year. Starting at age 53, women should consider having a mammogram (breast X-ray test) every year. Women who have a family history of breast cancer should talk to their health care provider about genetic screening. Women at a high risk of breast cancer should talk to their health care providers about having an MRI and a mammogram every year.  Breast cancer gene (BRCA)-related cancer risk assessment is recommended for women who have family members with BRCA-related cancers. BRCA-related cancers include breast, ovarian, tubal, and peritoneal cancers. Having family members with these cancers may be associated with an increased risk for harmful changes (mutations) in the breast cancer genes BRCA1 and BRCA2. Results of the assessment will determine the need for genetic counseling and  BRCA1 and BRCA2 testing.  Routine pelvic exams to screen for cancer are no longer recommended for nonpregnant women who are considered low risk for cancer of the pelvic organs (ovaries, uterus, and vagina) and who do not have symptoms. Ask your health care provider if a screening pelvic exam is right for you.  If you have had past treatment for cervical cancer or a condition that could lead to cancer, you need Pap tests and screening for cancer for at least 20 years after your treatment. If Pap tests have been discontinued, your risk factors (such as having a new sexual partner) need to be reassessed to determine if screening should be resumed. Some women have medical problems that increase the chance of getting cervical cancer. In these cases, your health care provider may recommend more frequent screening and Pap tests.  The HPV test is an additional test that may be used for cervical cancer screening. The HPV test looks for the virus that can cause the cell changes on the cervix. The cells collected during the Pap test can be  tested for HPV. The HPV test could be used to screen women aged 30 years and older, and should be used in women of any age who have unclear Pap test results. After the age of 30, women should have HPV testing at the same frequency as a Pap test.  Colorectal cancer can be detected and often prevented. Most routine colorectal cancer screening begins at the age of 50 years and continues through age 75 years. However, your health care provider may recommend screening at an earlier age if you have risk factors for colon cancer. On a yearly basis, your health care provider may provide home test kits to check for hidden blood in the stool. Use of a small camera at the end of a tube, to directly examine the colon (sigmoidoscopy or colonoscopy), can detect the earliest forms of colorectal cancer. Talk to your health care provider about this at age 50, when routine screening begins. Direct  exam of the colon should be repeated every 5-10 years through age 75 years, unless early forms of pre-cancerous polyps or small growths are found.  People who are at an increased risk for hepatitis B should be screened for this virus. You are considered at high risk for hepatitis B if:  You were born in a country where hepatitis B occurs often. Talk with your health care provider about which countries are considered high risk.  Your parents were born in a high-risk country and you have not received a shot to protect against hepatitis B (hepatitis B vaccine).  You have HIV or AIDS.  You use needles to inject street drugs.  You live with, or have sex with, someone who has hepatitis B.  You get hemodialysis treatment.  You take certain medicines for conditions like cancer, organ transplantation, and autoimmune conditions.  Hepatitis C blood testing is recommended for all people born from 1945 through 1965 and any individual with known risks for hepatitis C.  Practice safe sex. Use condoms and avoid high-risk sexual practices to reduce the spread of sexually transmitted infections (STIs). STIs include gonorrhea, chlamydia, syphilis, trichomonas, herpes, HPV, and human immunodeficiency virus (HIV). Herpes, HIV, and HPV are viral illnesses that have no cure. They can result in disability, cancer, and death.  You should be screened for sexually transmitted illnesses (STIs) including gonorrhea and chlamydia if:  You are sexually active and are younger than 24 years.  You are older than 24 years and your health care provider tells you that you are at risk for this type of infection.  Your sexual activity has changed since you were last screened and you are at an increased risk for chlamydia or gonorrhea. Ask your health care provider if you are at risk.  If you are at risk of being infected with HIV, it is recommended that you take a prescription medicine daily to prevent HIV infection. This is  called preexposure prophylaxis (PrEP). You are considered at risk if:  You are a heterosexual woman, are sexually active, and are at increased risk for HIV infection.  You take drugs by injection.  You are sexually active with a partner who has HIV.  Talk with your health care provider about whether you are at high risk of being infected with HIV. If you choose to begin PrEP, you should first be tested for HIV. You should then be tested every 3 months for as long as you are taking PrEP.  Osteoporosis is a disease in which the bones lose minerals and strength   with aging. This can result in serious bone fractures or breaks. The risk of osteoporosis can be identified using a bone density scan. Women ages 65 years and over and women at risk for fractures or osteoporosis should discuss screening with their health care providers. Ask your health care provider whether you should take a calcium supplement or vitamin D to reduce the rate of osteoporosis.  Menopause can be associated with physical symptoms and risks. Hormone replacement therapy is available to decrease symptoms and risks. You should talk to your health care provider about whether hormone replacement therapy is right for you.  Use sunscreen. Apply sunscreen liberally and repeatedly throughout the day. You should seek shade when your shadow is shorter than you. Protect yourself by wearing long sleeves, pants, a wide-brimmed hat, and sunglasses year round, whenever you are outdoors.  Once a month, do a whole body skin exam, using a mirror to look at the skin on your back. Tell your health care provider of new moles, moles that have irregular borders, moles that are larger than a pencil eraser, or moles that have changed in shape or color.  Stay current with required vaccines (immunizations).  Influenza vaccine. All adults should be immunized every year.  Tetanus, diphtheria, and acellular pertussis (Td, Tdap) vaccine. Pregnant women should  receive 1 dose of Tdap vaccine during each pregnancy. The dose should be obtained regardless of the length of time since the last dose. Immunization is preferred during the 27th-36th week of gestation. An adult who has not previously received Tdap or who does not know her vaccine status should receive 1 dose of Tdap. This initial dose should be followed by tetanus and diphtheria toxoids (Td) booster doses every 10 years. Adults with an unknown or incomplete history of completing a 3-dose immunization series with Td-containing vaccines should begin or complete a primary immunization series including a Tdap dose. Adults should receive a Td booster every 10 years.  Varicella vaccine. An adult without evidence of immunity to varicella should receive 2 doses or a second dose if she has previously received 1 dose. Pregnant females who do not have evidence of immunity should receive the first dose after pregnancy. This first dose should be obtained before leaving the health care facility. The second dose should be obtained 4-8 weeks after the first dose.  Human papillomavirus (HPV) vaccine. Females aged 13-26 years who have not received the vaccine previously should obtain the 3-dose series. The vaccine is not recommended for use in pregnant females. However, pregnancy testing is not needed before receiving a dose. If a female is found to be pregnant after receiving a dose, no treatment is needed. In that case, the remaining doses should be delayed until after the pregnancy. Immunization is recommended for any person with an immunocompromised condition through the age of 26 years if she did not get any or all doses earlier. During the 3-dose series, the second dose should be obtained 4-8 weeks after the first dose. The third dose should be obtained 24 weeks after the first dose and 16 weeks after the second dose.  Zoster vaccine. One dose is recommended for adults aged 60 years or older unless certain conditions are  present.  Measles, mumps, and rubella (MMR) vaccine. Adults born before 1957 generally are considered immune to measles and mumps. Adults born in 1957 or later should have 1 or more doses of MMR vaccine unless there is a contraindication to the vaccine or there is laboratory evidence of immunity to   each of the three diseases. A routine second dose of MMR vaccine should be obtained at least 28 days after the first dose for students attending postsecondary schools, health care workers, or international travelers. People who received inactivated measles vaccine or an unknown type of measles vaccine during 1963-1967 should receive 2 doses of MMR vaccine. People who received inactivated mumps vaccine or an unknown type of mumps vaccine before 1979 and are at high risk for mumps infection should consider immunization with 2 doses of MMR vaccine. For females of childbearing age, rubella immunity should be determined. If there is no evidence of immunity, females who are not pregnant should be vaccinated. If there is no evidence of immunity, females who are pregnant should delay immunization until after pregnancy. Unvaccinated health care workers born before 1957 who lack laboratory evidence of measles, mumps, or rubella immunity or laboratory confirmation of disease should consider measles and mumps immunization with 2 doses of MMR vaccine or rubella immunization with 1 dose of MMR vaccine.  Pneumococcal 13-valent conjugate (PCV13) vaccine. When indicated, a person who is uncertain of her immunization history and has no record of immunization should receive the PCV13 vaccine. An adult aged 19 years or older who has certain medical conditions and has not been previously immunized should receive 1 dose of PCV13 vaccine. This PCV13 should be followed with a dose of pneumococcal polysaccharide (PPSV23) vaccine. The PPSV23 vaccine dose should be obtained at least 8 weeks after the dose of PCV13 vaccine. An adult aged 19  years or older who has certain medical conditions and previously received 1 or more doses of PPSV23 vaccine should receive 1 dose of PCV13. The PCV13 vaccine dose should be obtained 1 or more years after the last PPSV23 vaccine dose.  Pneumococcal polysaccharide (PPSV23) vaccine. When PCV13 is also indicated, PCV13 should be obtained first. All adults aged 65 years and older should be immunized. An adult younger than age 65 years who has certain medical conditions should be immunized. Any person who resides in a nursing home or long-term care facility should be immunized. An adult smoker should be immunized. People with an immunocompromised condition and certain other conditions should receive both PCV13 and PPSV23 vaccines. People with human immunodeficiency virus (HIV) infection should be immunized as soon as possible after diagnosis. Immunization during chemotherapy or radiation therapy should be avoided. Routine use of PPSV23 vaccine is not recommended for American Indians, [place] Natives, or people younger than 65 years unless there are medical conditions that require PPSV23 vaccine. When indicated, people who have unknown immunization and have no record of immunization should receive PPSV23 vaccine. One-time revaccination 5 years after the first dose of PPSV23 is recommended for people aged 19-64 years who have chronic kidney failure, nephrotic syndrome, asplenia, or immunocompromised conditions. People who received 1-2 doses of PPSV23 before age 65 years should receive another dose of PPSV23 vaccine at age 65 years or later if at least 5 years have passed since the previous dose. Doses of PPSV23 are not needed for people immunized with PPSV23 at or after age 65 years.  Meningococcal vaccine. Adults with asplenia or persistent complement component deficiencies should receive 2 doses of quadrivalent meningococcal conjugate (MenACWY-D) vaccine. The doses should be obtained at least 2 months apart.  Microbiologists working with certain meningococcal bacteria, military recruits, people at risk during an outbreak, and people who travel to or live in countries with a high rate of meningitis should be immunized. A first-year college student up through age   21 years who is living in a residence hall should receive a dose if she did not receive a dose on or after her 16th birthday. Adults who have certain high-risk conditions should receive one or more doses of vaccine.  Hepatitis A vaccine. Adults who wish to be protected from this disease, have certain high-risk conditions, work with hepatitis A-infected animals, work in hepatitis A research labs, or travel to or work in countries with a high rate of hepatitis A should be immunized. Adults who were previously unvaccinated and who anticipate close contact with an international adoptee during the first 60 days after arrival in the [place] from a country with a high rate of hepatitis A should be immunized.  Hepatitis B vaccine. Adults who wish to be protected from this disease, have certain high-risk conditions, may be exposed to blood or other infectious body fluids, are household contacts or sex partners of hepatitis B positive people, are clients or workers in certain care facilities, or travel to or work in countries with a high rate of hepatitis B should be immunized.  Haemophilus influenzae type b (Hib) vaccine. A previously unvaccinated person with asplenia or sickle cell disease or having a scheduled splenectomy should receive 1 dose of Hib vaccine. Regardless of previous immunization, a recipient of a hematopoietic stem cell transplant should receive a 3-dose series 6-12 months after her successful transplant. Hib vaccine is not recommended for adults with HIV infection. Preventive Services / Frequency Ages 64 to 68 years  Blood pressure check.** / Every 1 to 2 years.  Lipid and cholesterol check.** / Every 5 years beginning at age  22.  Clinical breast exam.** / Every 3 years for women in their 88s and 53s.  BRCA-related cancer risk assessment.** / For women who have family members with a BRCA-related cancer (breast, ovarian, tubal, or peritoneal cancers).  Pap test.** / Every 2 years from ages 90 through 51. Every 3 years starting at age 21 through age 56 or 3 with a history of 3 consecutive normal Pap tests.  HPV screening.** / Every 3 years from ages 24 through ages 1 to 46 with a history of 3 consecutive normal Pap tests.  Hepatitis C blood test.** / For any individual with known risks for hepatitis C.  Skin self-exam. / Monthly.  Influenza vaccine. / Every year.  Tetanus, diphtheria, and acellular pertussis (Tdap, Td) vaccine.** / Consult your health care provider. Pregnant women should receive 1 dose of Tdap vaccine during each pregnancy. 1 dose of Td every 10 years.  Varicella vaccine.** / Consult your health care provider. Pregnant females who do not have evidence of immunity should receive the first dose after pregnancy.  HPV vaccine. / 3 doses over 6 months, if 72 and younger. The vaccine is not recommended for use in pregnant females. However, pregnancy testing is not needed before receiving a dose.  Measles, mumps, rubella (MMR) vaccine.** / You need at least 1 dose of MMR if you were born in 1957 or later. You may also need a 2nd dose. For females of childbearing age, rubella immunity should be determined. If there is no evidence of immunity, females who are not pregnant should be vaccinated. If there is no evidence of immunity, females who are pregnant should delay immunization until after pregnancy.  Pneumococcal 13-valent conjugate (PCV13) vaccine.** / Consult your health care provider.  Pneumococcal polysaccharide (PPSV23) vaccine.** / 1 to 2 doses if you smoke cigarettes or if you have certain conditions.  Meningococcal vaccine.** /  1 dose if you are age 19 to 21 years and a first-year college  student living in a residence hall, or have one of several medical conditions, you need to get vaccinated against meningococcal disease. You may also need additional booster doses.  Hepatitis A vaccine.** / Consult your health care provider.  Hepatitis B vaccine.** / Consult your health care provider.  Haemophilus influenzae type b (Hib) vaccine.** / Consult your health care provider. Ages 40 to 64 years  Blood pressure check.** / Every 1 to 2 years.  Lipid and cholesterol check.** / Every 5 years beginning at age 20 years.  Lung cancer screening. / Every year if you are aged 55-80 years and have a 30-pack-year history of smoking and currently smoke or have quit within the past 15 years. Yearly screening is stopped once you have quit smoking for at least 15 years or develop a health problem that would prevent you from having lung cancer treatment.  Clinical breast exam.** / Every year after age 40 years.  BRCA-related cancer risk assessment.** / For women who have family members with a BRCA-related cancer (breast, ovarian, tubal, or peritoneal cancers).  Mammogram.** / Every year beginning at age 40 years and continuing for as long as you are in good health. Consult with your health care provider.  Pap test.** / Every 3 years starting at age 30 years through age 65 or 70 years with a history of 3 consecutive normal Pap tests.  HPV screening.** / Every 3 years from ages 30 years through ages 65 to 70 years with a history of 3 consecutive normal Pap tests.  Fecal occult blood test (FOBT) of stool. / Every year beginning at age 50 years and continuing until age 75 years. You may not need to do this test if you get a colonoscopy every 10 years.  Flexible sigmoidoscopy or colonoscopy.** / Every 5 years for a flexible sigmoidoscopy or every 10 years for a colonoscopy beginning at age 50 years and continuing until age 75 years.  Hepatitis C blood test.** / For all people born from 1945 through  1965 and any individual with known risks for hepatitis C.  Skin self-exam. / Monthly.  Influenza vaccine. / Every year.  Tetanus, diphtheria, and acellular pertussis (Tdap/Td) vaccine.** / Consult your health care provider. Pregnant women should receive 1 dose of Tdap vaccine during each pregnancy. 1 dose of Td every 10 years.  Varicella vaccine.** / Consult your health care provider. Pregnant females who do not have evidence of immunity should receive the first dose after pregnancy.  Zoster vaccine.** / 1 dose for adults aged 60 years or older.  Measles, mumps, rubella (MMR) vaccine.** / You need at least 1 dose of MMR if you were born in 1957 or later. You may also need a 2nd dose. For females of childbearing age, rubella immunity should be determined. If there is no evidence of immunity, females who are not pregnant should be vaccinated. If there is no evidence of immunity, females who are pregnant should delay immunization until after pregnancy.  Pneumococcal 13-valent conjugate (PCV13) vaccine.** / Consult your health care provider.  Pneumococcal polysaccharide (PPSV23) vaccine.** / 1 to 2 doses if you smoke cigarettes or if you have certain conditions.  Meningococcal vaccine.** / Consult your health care provider.  Hepatitis A vaccine.** / Consult your health care provider.  Hepatitis B vaccine.** / Consult your health care provider.  Haemophilus influenzae type b (Hib) vaccine.** / Consult your health care provider. Ages 65   years and over  Blood pressure check.** / Every 1 to 2 years.  Lipid and cholesterol check.** / Every 5 years beginning at age 22 years.  Lung cancer screening. / Every year if you are aged 73-80 years and have a 30-pack-year history of smoking and currently smoke or have quit within the past 15 years. Yearly screening is stopped once you have quit smoking for at least 15 years or develop a health problem that would prevent you from having lung cancer  treatment.  Clinical breast exam.** / Every year after age 4 years.  BRCA-related cancer risk assessment.** / For women who have family members with a BRCA-related cancer (breast, ovarian, tubal, or peritoneal cancers).  Mammogram.** / Every year beginning at age 40 years and continuing for as long as you are in good health. Consult with your health care provider.  Pap test.** / Every 3 years starting at age 9 years through age 34 or 91 years with 3 consecutive normal Pap tests. Testing can be stopped between 65 and 70 years with 3 consecutive normal Pap tests and no abnormal Pap or HPV tests in the past 10 years.  HPV screening.** / Every 3 years from ages 57 years through ages 64 or 45 years with a history of 3 consecutive normal Pap tests. Testing can be stopped between 65 and 70 years with 3 consecutive normal Pap tests and no abnormal Pap or HPV tests in the past 10 years.  Fecal occult blood test (FOBT) of stool. / Every year beginning at age 15 years and continuing until age 17 years. You may not need to do this test if you get a colonoscopy every 10 years.  Flexible sigmoidoscopy or colonoscopy.** / Every 5 years for a flexible sigmoidoscopy or every 10 years for a colonoscopy beginning at age 86 years and continuing until age 71 years.  Hepatitis C blood test.** / For all people born from [birth year] through 1966 and any individual with known risks for hepatitis C.  Osteoporosis screening.** / A one-time screening for women ages 83 years and over and women at risk for fractures or osteoporosis.  Skin self-exam. / Monthly.  Influenza vaccine. / Every year.  Tetanus, diphtheria, and acellular pertussis (Tdap/Td) vaccine.** / 1 dose of Td every 10 years.  Varicella vaccine.** / Consult your health care provider.  Zoster vaccine.** / 1 dose for adults aged 61 years or older.  Pneumococcal 13-valent conjugate (PCV13) vaccine.** / Consult your health care provider.  Pneumococcal  polysaccharide (PPSV23) vaccine.** / 1 dose for all adults aged 28 years and older.  Meningococcal vaccine.** / Consult your health care provider.  Hepatitis A vaccine.** / Consult your health care provider.  Hepatitis B vaccine.** / Consult your health care provider.  Haemophilus influenzae type b (Hib) vaccine.** / Consult your health care provider. ** Family history and personal history of risk and conditions may change your health care provider's recommendations. Document Released: 01/26/2002 Document Revised: 04/16/2014 Document Reviewed: 04/27/2011 [facility] Patient Information 2015 [place], [place]. This information is not intended to replace advice given to you by your health care provider. Make sure you discuss any questions you have with your health care provider.

## 2015-03-15 NOTE — Progress Notes (Signed)
Pre visit review using our clinic review tool, if applicable. No additional management support is needed unless otherwise documented below in the visit note. 

## 2015-03-17 DIAGNOSIS — Z1231 Encounter for screening mammogram for malignant neoplasm of breast: Secondary | ICD-10-CM | POA: Insufficient documentation

## 2015-03-17 NOTE — Progress Notes (Signed)
   Subjective:    Patient ID: Julia Olson, female    DOB: 07/16/1951, 64 y.o.   MRN: [id number]  Gastrophageal Reflux She reports no abdominal pain, no belching, no chest pain, no choking, no coughing, no dysphagia, no early satiety, no globus sensation, no heartburn, no hoarse voice, no nausea, no sore throat, no stridor, no tooth decay, no water brash or no wheezing. This is a chronic problem. The current episode started more than 1 year ago. The problem occurs rarely. The problem has been unchanged. Nothing aggravates the symptoms. Pertinent negatives include no anemia, fatigue, melena, muscle weakness, orthopnea or weight loss. She has tried a PPI for the symptoms.      Review of Systems  Constitutional: Negative for fever, chills, weight loss, diaphoresis, appetite change and fatigue.  HENT: Negative.  Negative for hoarse voice and sore throat.   Eyes: Negative.   Respiratory: Negative.  Negative for cough, choking, shortness of breath, wheezing and stridor.   Cardiovascular: Negative.  Negative for chest pain, palpitations and leg swelling.  Gastrointestinal: Negative.  Negative for heartburn, dysphagia, nausea, vomiting, abdominal pain, constipation, blood in stool and melena.  Endocrine: Negative.   Genitourinary: Negative.   Musculoskeletal: Negative.  Negative for myalgias, back pain, gait problem, muscle weakness and neck pain.  Skin: Negative.  Negative for rash.  Allergic/Immunologic: Negative.   Neurological: Negative.  Negative for dizziness.  Hematological: Negative.  Negative for adenopathy. Does not bruise/bleed easily.  Psychiatric/Behavioral: Negative.        Objective:   Physical Exam  Constitutional: She is oriented to person, place, and time. She appears well-developed and well-nourished. No distress.  HENT:  Head: Normocephalic and atraumatic.  Mouth/Throat: Oropharynx is clear and moist. No oropharyngeal exudate.  Eyes: Conjunctivae are normal. Right eye  exhibits no discharge. Left eye exhibits no discharge. No scleral icterus.  Neck: Normal range of motion. Neck supple. No JVD present. No tracheal deviation present. No thyromegaly present.  Cardiovascular: Normal rate, regular rhythm, normal heart sounds and intact distal pulses.  Exam reveals no gallop and no friction rub.   No murmur heard. Pulmonary/Chest: Effort normal and breath sounds normal. No stridor. No respiratory distress. She has no wheezes. She has no rales. She exhibits no tenderness.  Abdominal: Soft. Bowel sounds are normal. She exhibits no distension and no mass. There is no tenderness. There is no rebound and no guarding.  Musculoskeletal: Normal range of motion. She exhibits no edema or tenderness.  Lymphadenopathy:    She has no cervical adenopathy.  Neurological: She is oriented to person, place, and time.  Skin: Skin is warm and dry. No rash noted. She is not diaphoretic. No erythema. No pallor.  Psychiatric: She has a normal mood and affect. Her behavior is normal. Judgment and thought content normal.  Vitals reviewed.    Lab Results  Component Value Date   WBC 8.0 03/15/2015   HGB 11.2* 03/15/2015   HCT 35.3* 03/15/2015   PLT 226.0 03/15/2015   GLUCOSE 80 03/15/2015   CHOL 214* 03/15/2015   TRIG 91.0 03/15/2015   HDL 54.30 03/15/2015   LDLDIRECT 189.8 11/26/2011   LDLCALC 142* 03/15/2015   ALT 17 03/15/2015   AST 20 03/15/2015   NA 139 03/15/2015   K 3.9 03/15/2015   CL 103 03/15/2015   CREATININE 0.79 03/15/2015   BUN 12 03/15/2015   CO2 31 03/15/2015   TSH 2.21 03/15/2015       Assessment & Plan:

## 2015-03-17 NOTE — Assessment & Plan Note (Signed)
Vaccines were reviewed and updated Exam done Labs ordered She was referred for a mammogram Pt ed material was given

## 2015-03-17 NOTE — Assessment & Plan Note (Signed)
She is doing well on the PPI Will continue

## 2015-10-29 ENCOUNTER — Encounter: Payer: Self-pay | Admitting: Internal Medicine

## 2015-10-29 ENCOUNTER — Other Ambulatory Visit (INDEPENDENT_AMBULATORY_CARE_PROVIDER_SITE_OTHER): Payer: 59

## 2015-10-29 ENCOUNTER — Ambulatory Visit (INDEPENDENT_AMBULATORY_CARE_PROVIDER_SITE_OTHER): Payer: 59 | Admitting: Internal Medicine

## 2015-10-29 VITALS — BP 158/80 | HR 67 | Temp 97.9°F | Resp 16 | Ht 65.0 in | Wt 159.0 lb

## 2015-10-29 DIAGNOSIS — Z1159 Encounter for screening for other viral diseases: Secondary | ICD-10-CM

## 2015-10-29 DIAGNOSIS — D509 Iron deficiency anemia, unspecified: Secondary | ICD-10-CM

## 2015-10-29 DIAGNOSIS — K219 Gastro-esophageal reflux disease without esophagitis: Secondary | ICD-10-CM

## 2015-10-29 DIAGNOSIS — Z1211 Encounter for screening for malignant neoplasm of colon: Secondary | ICD-10-CM | POA: Insufficient documentation

## 2015-10-29 DIAGNOSIS — D510 Vitamin B12 deficiency anemia due to intrinsic factor deficiency: Secondary | ICD-10-CM | POA: Insufficient documentation

## 2015-10-29 DIAGNOSIS — Z0000 Encounter for general adult medical examination without abnormal findings: Secondary | ICD-10-CM

## 2015-10-29 DIAGNOSIS — Z1231 Encounter for screening mammogram for malignant neoplasm of breast: Secondary | ICD-10-CM

## 2015-10-29 LAB — IBC PANEL
Iron: 28 ug/dL — ABNORMAL LOW (ref 42–145)
SATURATION RATIOS: 5.6 % — AB (ref 20.0–50.0)
TRANSFERRIN: 356 mg/dL (ref 212.0–360.0)

## 2015-10-29 LAB — CBC WITH DIFFERENTIAL/PLATELET
BASOS ABS: 0 10*3/uL (ref 0.0–0.1)
Basophils Relative: 0.6 % (ref 0.0–3.0)
Eosinophils Absolute: 0.2 10*3/uL (ref 0.0–0.7)
Eosinophils Relative: 3.4 % (ref 0.0–5.0)
HCT: 35.3 % — ABNORMAL LOW (ref 36.0–46.0)
Hemoglobin: 11.1 g/dL — ABNORMAL LOW (ref 12.0–15.0)
LYMPHS ABS: 2.3 10*3/uL (ref 0.7–4.0)
Lymphocytes Relative: 32.9 % (ref 12.0–46.0)
MCHC: 31.3 g/dL (ref 30.0–36.0)
MCV: 74.7 fl — ABNORMAL LOW (ref 78.0–100.0)
MONO ABS: 0.6 10*3/uL (ref 0.1–1.0)
MONOS PCT: 8.3 % (ref 3.0–12.0)
NEUTROS PCT: 54.8 % (ref 43.0–77.0)
Neutro Abs: 3.9 10*3/uL (ref 1.4–7.7)
Platelets: 218 10*3/uL (ref 150.0–400.0)
RBC: 4.73 Mil/uL (ref 3.87–5.11)
RDW: 16.3 % — ABNORMAL HIGH (ref 11.5–15.5)
WBC: 7.1 10*3/uL (ref 4.0–10.5)

## 2015-10-29 LAB — FOLATE: Folate: >23.8 ng/mL (ref >5.9)

## 2015-10-29 LAB — VITAMIN B12: VITAMIN B 12: 374 pg/mL (ref 211–911)

## 2015-10-29 LAB — FERRITIN: FERRITIN: 3 ng/mL — AB (ref 10.0–291.0)

## 2015-10-29 MED ORDER — OMEPRAZOLE 20 MG PO CPDR: 40.0000 mg | DELAYED_RELEASE_CAPSULE | Freq: Every day | ORAL | Status: DC

## 2015-10-29 NOTE — Progress Notes (Signed)
Pre visit review using our clinic review tool, if applicable. No additional management support is needed unless otherwise documented below in the visit note. 

## 2015-10-29 NOTE — Patient Instructions (Signed)
Anemia, Nonspecific Anemia is a condition in which the concentration of red blood cells or hemoglobin in the blood is below normal. Hemoglobin is a substance in red blood cells that carries oxygen to the tissues of the body. Anemia results in not enough oxygen reaching these tissues.  CAUSES  Common causes of anemia include:   Excessive bleeding. Bleeding may be internal or external. This includes excessive bleeding from periods (in women) or from the intestine.   Poor nutrition.   Chronic kidney, thyroid, and liver disease.  Bone marrow disorders that decrease red blood cell production.  Cancer and treatments for cancer.  HIV, AIDS, and their treatments.  Spleen problems that increase red blood cell destruction.  Blood disorders.  Excess destruction of red blood cells due to infection, medicines, and autoimmune disorders. SIGNS AND SYMPTOMS   Minor weakness.   Dizziness.   Headache.  Palpitations.   Shortness of breath, especially with exercise.   Paleness.  Cold sensitivity.  Indigestion.  Nausea.  Difficulty sleeping.  Difficulty concentrating. Symptoms may occur suddenly or they may develop slowly.  DIAGNOSIS  Additional blood tests are often needed. These help your health care provider determine the best treatment. Your health care provider will check your stool for blood and look for other causes of blood loss.  TREATMENT  Treatment varies depending on the cause of the anemia. Treatment can include:   Supplements of iron, vitamin B12, or folic acid.   Hormone medicines.   A blood transfusion. This may be needed if blood loss is severe.   Hospitalization. This may be needed if there is significant continual blood loss.   Dietary changes.  Spleen removal. HOME CARE INSTRUCTIONS Keep all follow-up appointments. It often takes many weeks to correct anemia, and having your health care provider check on your condition and your response to  treatment is very important. SEEK IMMEDIATE MEDICAL CARE IF:   You develop extreme weakness, shortness of breath, or chest pain.   You become dizzy or have trouble concentrating.  You develop heavy vaginal bleeding.   You develop a rash.   You have bloody or black, tarry stools.   You faint.   You vomit up blood.   You vomit repeatedly.   You have abdominal pain.  You have a fever or persistent symptoms for more than 2-3 days.   You have a fever and your symptoms suddenly get worse.   You are dehydrated.  MAKE SURE YOU:  Understand these instructions.  Will watch your condition.  Will get help right away if you are not doing well or get worse.   This information is not intended to replace advice given to you by your health care provider. Make sure you discuss any questions you have with your health care provider.   Document Released: 01/07/2005 Document Revised: 08/02/2013 Document Reviewed: 05/26/2013 Elsevier Interactive Patient Education 2016 [employer]  

## 2015-10-29 NOTE — Progress Notes (Signed)
Subjective:  Patient ID: Julia Olson, female    DOB: June 18, 1951  Age: 64 y.o. MRN: [id number]  CC: Anemia; Gastroesophageal Reflux; and Annual Exam   HPI Julia Olson presents for a complete physical and follow-up on anemia. She feels well and offers no complaints. She is not aware of any sources of blood loss. She recently had lab work done by her employer, [employer], and she was told that the results were okay but she does not have any results with her today.  Outpatient Prescriptions Prior to Visit  Medication Sig Dispense Refill  . omeprazole (PRILOSEC) 20 MG capsule Take 2 capsules (40 mg total) by mouth daily. 180 capsule 3   No facility-administered medications prior to visit.    ROS Review of Systems  Constitutional: Negative.  Negative for fever, chills, diaphoresis, appetite change and fatigue.  HENT: Negative.  Negative for trouble swallowing and voice change.   Eyes: Negative.   Respiratory: Negative.  Negative for cough, choking, chest tightness, shortness of breath and stridor.   Cardiovascular: Negative.  Negative for chest pain, palpitations and leg swelling.  Gastrointestinal: Negative.  Negative for nausea, vomiting, abdominal pain, diarrhea, constipation and blood in stool.  Endocrine: Negative.   Genitourinary: Negative.  Negative for dysuria, urgency, hematuria, flank pain and difficulty urinating.  Musculoskeletal: Negative.  Negative for myalgias, back pain, joint swelling and arthralgias.  Skin: Negative.  Negative for color change and rash.  Allergic/Immunologic: Negative.   Neurological: Negative.  Negative for dizziness, tremors, seizures, facial asymmetry, weakness, numbness and headaches.  Hematological: Negative.   Psychiatric/Behavioral: Negative.     Objective:  BP 158/80 mmHg  Pulse 67  Temp(Src) 97.9 F (36.6 C) (Oral)  Resp 16  Ht 5\' 5"  (1.651 m)  Wt 159 lb (72.122 kg)  BMI 26.46 kg/m2  SpO2 97%  BP Readings from Last 3  Encounters:  10/29/15 158/80  03/15/15 142/82  11/26/11 136/80    Wt Readings from Last 3 Encounters:  10/29/15 159 lb (72.122 kg)  03/15/15 151 lb (68.493 kg)  11/26/11 165 lb (74.844 kg)    Physical Exam  Constitutional: She is oriented to person, place, and time. No distress.  HENT:  Head: Normocephalic and atraumatic.  Mouth/Throat: Oropharynx is clear and moist. No oropharyngeal exudate.  Eyes: Conjunctivae are normal. Right eye exhibits no discharge. Left eye exhibits no discharge. No scleral icterus.  Neck: Normal range of motion. Neck supple. No JVD present. No tracheal deviation present. No thyromegaly present.  Cardiovascular: Normal rate, regular rhythm, normal heart sounds and intact distal pulses.  Exam reveals no gallop and no friction rub.   No murmur heard. Pulmonary/Chest: Effort normal and breath sounds normal. No stridor. No respiratory distress. She has no wheezes. She has no rales. She exhibits no tenderness.  Abdominal: Soft. Bowel sounds are normal. She exhibits no distension and no mass. There is no tenderness. There is no rebound and no guarding.  Genitourinary:  GU and breast exams deferred at her request She sees a GYN annually  Musculoskeletal: Normal range of motion. She exhibits no edema or tenderness.  Lymphadenopathy:    She has no cervical adenopathy.  Neurological: She is oriented to person, place, and time.  Skin: Skin is warm and dry. No rash noted. She is not diaphoretic. No erythema. No pallor.  Psychiatric: She has a normal mood and affect. Her behavior is normal. Judgment and thought content normal.  Vitals reviewed.   Lab Results  Component Value Date  WBC 7.1 10/29/2015   HGB 11.1* 10/29/2015   HCT 35.3* 10/29/2015   PLT 218.0 10/29/2015   GLUCOSE 80 03/15/2015   CHOL 214* 03/15/2015   TRIG 91.0 03/15/2015   HDL 54.30 03/15/2015   LDLDIRECT 189.8 11/26/2011   LDLCALC 142* 03/15/2015   ALT 17 03/15/2015   AST 20 03/15/2015    NA 139 03/15/2015   K 3.9 03/15/2015   CL 103 03/15/2015   CREATININE 0.79 03/15/2015   BUN 12 03/15/2015   CO2 31 03/15/2015   TSH 2.21 03/15/2015    No results found.  Assessment & Plan:   Julia Olson was seen today for anemia, gastroesophageal reflux and annual exam.  Diagnoses and all orders for this visit:  Gastroesophageal reflux disease without esophagitis- heartburn and other symptoms appear to be well controlled, I've asked her to follow-up with GI to see if she needs upper endoscopy related to her iron deficiency anemia. -     omeprazole (PRILOSEC) 20 MG capsule; Take 2 capsules (40 mg total) by mouth daily.  Need for hepatitis C screening test -     Hepatitis C Antibody; Future  Visit for screening mammogram -     MM Digital Screening; Future -     MM DIGITAL SCREENING BILATERAL; Future  Pernicious anemia- she has iron deficiency anemia, will treat. -     CBC with Differential/Platelet; Future -     IBC panel; Future -     Folate; Future -     Ferritin; Future -     Vitamin B12; Future  Screen for colon cancer -     Ambulatory referral to Gastroenterology  Iron deficiency anemia- will refer to GI to evaluate for possible sources of blood loss, will start iron replacement therapy. -     Ferrous Fumarate-Folic Acid (HEMOCYTE-F) 324-1 MG TABS; Take 1 tablet by mouth 2 (two) times daily with a meal.   I am having Julia Olson start on Ferrous Fumarate-Folic Acid. I am also having her maintain her omeprazole.  Meds ordered this encounter  Medications  . omeprazole (PRILOSEC) 20 MG capsule    Sig: Take 2 capsules (40 mg total) by mouth daily.    Dispense:  180 capsule    Refill:  3  . Ferrous Fumarate-Folic Acid (HEMOCYTE-F) 324-1 MG TABS    Sig: Take 1 tablet by mouth 2 (two) times daily with a meal.    Dispense:  60 each    Refill:  11   See AVS for instructions about healthy living and anticipatory guidance.  Follow-up: Return in about 3 months (around  01/29/2016).  Julia Calico, MD

## 2015-10-30 ENCOUNTER — Encounter: Payer: Self-pay | Admitting: Internal Medicine

## 2015-10-30 DIAGNOSIS — D509 Iron deficiency anemia, unspecified: Secondary | ICD-10-CM | POA: Insufficient documentation

## 2015-10-30 LAB — HEPATITIS C ANTIBODY: HCV Ab: NEGATIVE

## 2015-10-30 MED ORDER — FERROUS FUMARATE-FOLIC ACID 324-1 MG PO TABS: 1.0000 | ORAL_TABLET | Freq: Two times a day (BID) | ORAL | Status: DC

## 2015-10-30 NOTE — Assessment & Plan Note (Signed)

## 2015-12-26 ENCOUNTER — Ambulatory Visit
Admission: RE | Admit: 2015-12-26 | Discharge: 2015-12-26 | Disposition: A | Discharge status: Home / Self Care | Payer: 59 | Source: Ambulatory Visit | Attending: Internal Medicine | Admitting: Internal Medicine

## 2015-12-26 DIAGNOSIS — Z1231 Encounter for screening mammogram for malignant neoplasm of breast: Secondary | ICD-10-CM

## 2015-12-27 LAB — HM MAMMOGRAPHY

## 2015-12-29 NOTE — Addendum Note (Signed)
Addended by: Janith Lima on: 12/29/2015 02:14 PM   Modules accepted: Miquel Dunn

## 2016-09-23 ENCOUNTER — Encounter: Payer: Self-pay | Admitting: Internal Medicine

## 2016-11-10 ENCOUNTER — Encounter: Payer: Self-pay | Admitting: Internal Medicine

## 2016-11-10 ENCOUNTER — Other Ambulatory Visit (INDEPENDENT_AMBULATORY_CARE_PROVIDER_SITE_OTHER): Payer: BLUE CROSS/BLUE SHIELD

## 2016-11-10 ENCOUNTER — Ambulatory Visit (INDEPENDENT_AMBULATORY_CARE_PROVIDER_SITE_OTHER): Payer: BLUE CROSS/BLUE SHIELD | Admitting: Internal Medicine

## 2016-11-10 VITALS — BP 118/74 | HR 66 | Temp 98.5°F | Ht 65.0 in | Wt 164.5 lb

## 2016-11-10 DIAGNOSIS — D508 Other iron deficiency anemias: Secondary | ICD-10-CM

## 2016-11-10 DIAGNOSIS — Z23 Encounter for immunization: Secondary | ICD-10-CM | POA: Diagnosis not present

## 2016-11-10 DIAGNOSIS — Z1211 Encounter for screening for malignant neoplasm of colon: Secondary | ICD-10-CM

## 2016-11-10 DIAGNOSIS — Z0000 Encounter for general adult medical examination without abnormal findings: Secondary | ICD-10-CM

## 2016-11-10 DIAGNOSIS — G63 Polyneuropathy in diseases classified elsewhere: Secondary | ICD-10-CM

## 2016-11-10 DIAGNOSIS — E538 Deficiency of other specified B group vitamins: Secondary | ICD-10-CM | POA: Diagnosis not present

## 2016-11-10 DIAGNOSIS — D510 Vitamin B12 deficiency anemia due to intrinsic factor deficiency: Secondary | ICD-10-CM

## 2016-11-10 DIAGNOSIS — E785 Hyperlipidemia, unspecified: Secondary | ICD-10-CM

## 2016-11-10 LAB — COMPREHENSIVE METABOLIC PANEL
ALK PHOS: 75 U/L (ref 39–117)
ALT: 15 U/L (ref 0–35)
AST: 16 U/L (ref 0–37)
Albumin: 4.3 g/dL (ref 3.5–5.2)
BILIRUBIN TOTAL: 0.6 mg/dL (ref 0.2–1.2)
BUN: 14 mg/dL (ref 6–23)
CO2: 30 mEq/L (ref 19–32)
Calcium: 10.1 mg/dL (ref 8.4–10.5)
Chloride: 104 mEq/L (ref 96–112)
Creatinine, Ser: 0.86 mg/dL (ref 0.40–1.20)
GFR: 70.12 mL/min (ref >60.00)
GLUCOSE: 88 mg/dL (ref 70–99)
Potassium: 4.5 mEq/L (ref 3.5–5.1)
SODIUM: 142 meq/L (ref 135–145)
TOTAL PROTEIN: 7.3 g/dL (ref 6.0–8.3)

## 2016-11-10 LAB — CBC WITH DIFFERENTIAL/PLATELET
BASOS ABS: 0 10*3/uL (ref 0.0–0.1)
Basophils Relative: 0.9 % (ref 0.0–3.0)
Eosinophils Absolute: 0.2 10*3/uL (ref 0.0–0.7)
Eosinophils Relative: 2.9 % (ref 0.0–5.0)
HCT: 43.2 % (ref 36.0–46.0)
HEMOGLOBIN: 14.4 g/dL (ref 12.0–15.0)
LYMPHS ABS: 1.9 10*3/uL (ref 0.7–4.0)
Lymphocytes Relative: 35.5 % (ref 12.0–46.0)
MCHC: 33.4 g/dL (ref 30.0–36.0)
MCV: 85 fl (ref 78.0–100.0)
MONO ABS: 0.5 10*3/uL (ref 0.1–1.0)
MONOS PCT: 8.7 % (ref 3.0–12.0)
NEUTROS PCT: 52 % (ref 43.0–77.0)
Neutro Abs: 2.8 10*3/uL (ref 1.4–7.7)
Platelets: 192 10*3/uL (ref 150.0–400.0)
RBC: 5.08 Mil/uL (ref 3.87–5.11)
RDW: 13.6 % (ref 11.5–15.5)
WBC: 5.5 10*3/uL (ref 4.0–10.5)

## 2016-11-10 LAB — FERRITIN: Ferritin: 8.9 ng/mL — ABNORMAL LOW (ref 10.0–291.0)

## 2016-11-10 LAB — LIPID PANEL
CHOLESTEROL: 249 mg/dL — AB (ref 0–200)
HDL: 47.9 mg/dL (ref >39.00)
LDL Cholesterol: 172 mg/dL — ABNORMAL HIGH (ref 0–99)
NonHDL: 200.97
TRIGLYCERIDES: 146 mg/dL (ref 0.0–149.0)
Total CHOL/HDL Ratio: 5
VLDL: 29.2 mg/dL (ref 0.0–40.0)

## 2016-11-10 LAB — TSH: TSH: 2.25 u[IU]/mL (ref 0.35–4.50)

## 2016-11-10 LAB — FOLATE: Folate: 20.7 ng/mL (ref >5.9)

## 2016-11-10 LAB — VITAMIN B12: Vitamin B-12: 295 pg/mL (ref 211–911)

## 2016-11-10 LAB — IBC PANEL
Iron: 92 ug/dL (ref 42–145)
Saturation Ratios: 20.7 % (ref 20.0–50.0)
Transferrin: 318 mg/dL (ref 212.0–360.0)

## 2016-11-10 MED ORDER — ZOSTER VACCINE LIVE 19400 UNT/0.65ML ~~LOC~~ SUSR: 0.6500 mL | Freq: Once | SUBCUTANEOUS | 0 refills | Status: AC

## 2016-11-10 MED ORDER — CYANOCOBALAMIN 2000 MCG PO TABS: 2000.0000 ug | ORAL_TABLET | Freq: Every day | ORAL | 3 refills | Status: AC

## 2016-11-10 NOTE — Patient Instructions (Signed)

## 2016-11-10 NOTE — Progress Notes (Signed)
Pre visit review using our clinic review tool, if applicable. No additional management support is needed unless otherwise documented below in the visit note. 

## 2016-11-10 NOTE — Progress Notes (Signed)
Subjective:  Patient ID: Julia Olson, female    DOB: 02/08/1951  Age: 65 y.o. MRN: [id number]  CC: Annual Exam; Anemia; and Hyperlipidemia   HPI Julia Olson presents for an AWV/CPX.  She needs a follow-up on history of anemia and mild B12 deficiency. She occasionally has tingling in her digits. She denies blood loss, fatigue, shortness of breath, or weakness. She also has a history of iron deficiency anemia and is due for repeat colonoscopy.    Past Medical History:  Diagnosis Date  . GERD 08/27/2010  . Headache(784.0) 08/27/2010  . 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 OSTEOARTHRITIS 08/27/2010   Past Surgical History:  Procedure Laterality Date  . ABDOMINAL HYSTERECTOMY      reports that she has never smoked. She does not have any smokeless tobacco history on file. She reports that she does not drink alcohol or use drugs. family history includes Alcohol abuse in her other; Arthritis in her maternal aunt and other; Cancer in her other; Diabetes in her other; Heart disease in her other; Hyperlipidemia in her other; Hypertension in her other. No Known Allergies  Outpatient Medications Prior to Visit  Medication Sig Dispense Refill  . omeprazole (PRILOSEC) 20 MG capsule Take 2 capsules (40 mg total) by mouth daily. 180 capsule 3  . Ferrous Fumarate-Folic Acid (HEMOCYTE-F) 324-1 MG TABS Take 1 tablet by mouth 2 (two) times daily with a meal. 60 each 11   No facility-administered medications prior to visit.     ROS Review of Systems  Constitutional: Negative for activity change, appetite change, diaphoresis, fatigue and unexpected weight change.  HENT: Negative.  Negative for trouble swallowing.   Eyes: Negative for visual disturbance.  Respiratory: Negative for cough, chest tightness, shortness of breath, wheezing and stridor.   Cardiovascular: Negative for chest pain, palpitations and leg swelling.  Gastrointestinal: Negative.  Negative for abdominal pain, anal bleeding,  blood in stool, diarrhea, nausea and vomiting.  Endocrine: Negative.  Negative for cold intolerance and heat intolerance.  Genitourinary: Negative.  Negative for decreased urine volume, difficulty urinating, dysuria, flank pain, frequency, hematuria, urgency and vaginal bleeding.  Musculoskeletal: Negative.  Negative for arthralgias, back pain, myalgias and neck pain.  Skin: Negative.  Negative for pallor and rash.  Allergic/Immunologic: Negative.   Neurological: Negative.  Negative for dizziness, tremors, weakness, light-headedness and numbness.  Hematological: Negative for adenopathy. Does not bruise/bleed easily.  Psychiatric/Behavioral: Negative.     Objective:  BP 118/74 (BP Location: Left Arm, Patient Position: Sitting, Cuff Size: Normal)   Pulse 66   Temp 98.5 F (36.9 C) (Oral)   Ht 5\' 5"  (1.651 m)   Wt 164 lb 8 oz (74.6 kg)   SpO2 97%   BMI 27.37 kg/m   BP Readings from Last 3 Encounters:  11/10/16 118/74  10/29/15 (!) 158/80  03/15/15 (!) 142/82    Wt Readings from Last 3 Encounters:  11/10/16 164 lb 8 oz (74.6 kg)  10/29/15 159 lb (72.1 kg)  03/15/15 151 lb (68.5 kg)    Physical Exam  Constitutional: She is oriented to person, place, and time. No distress.  HENT:  Head: Normocephalic and atraumatic.  Mouth/Throat: Oropharynx is clear and moist. No oropharyngeal exudate.  Eyes: Conjunctivae are normal. Right eye exhibits no discharge. Left eye exhibits no discharge. No scleral icterus.  Neck: Normal range of motion. Neck supple. No JVD present. No tracheal deviation present. No thyromegaly present.  Cardiovascular: Normal rate, regular rhythm, normal heart sounds and intact distal pulses.  Exam reveals no gallop and no friction rub.   No murmur heard. Pulmonary/Chest: Effort normal and breath sounds normal. No stridor. No respiratory distress. She has no wheezes. She has no rales. She exhibits no tenderness.  Abdominal: Soft. Bowel sounds are normal. She exhibits  no distension and no mass. There is no tenderness. There is no rebound and no guarding.  Musculoskeletal: Normal range of motion. She exhibits no edema, tenderness or deformity.  Lymphadenopathy:    She has no cervical adenopathy.  Neurological: She is oriented to person, place, and time.  Skin: Skin is warm and dry. No rash noted. She is not diaphoretic. No erythema. No pallor.  Psychiatric: She has a normal mood and affect. Her behavior is normal. Judgment and thought content normal.  Vitals reviewed.   Lab Results  Component Value Date   WBC 5.5 11/10/2016   HGB 14.4 11/10/2016   HCT 43.2 11/10/2016   PLT 192.0 11/10/2016   GLUCOSE 88 11/10/2016   CHOL 249 (H) 11/10/2016   TRIG 146.0 11/10/2016   HDL 47.90 11/10/2016   LDLDIRECT 189.8 11/26/2011   LDLCALC 172 (H) 11/10/2016   ALT 15 11/10/2016   AST 16 11/10/2016   NA 142 11/10/2016   K 4.5 11/10/2016   CL 104 11/10/2016   CREATININE 0.86 11/10/2016   BUN 14 11/10/2016   CO2 30 11/10/2016   TSH 2.25 11/10/2016    Mm Digital Screening Bilateral  Result Date: 12/27/2015 CLINICAL DATA:  Screening. EXAM: DIGITAL SCREENING BILATERAL MAMMOGRAM WITH CAD COMPARISON:  Previous exam(s). ACR Breast Density Category b: There are scattered areas of fibroglandular density. FINDINGS: There are no findings suspicious for malignancy. Images were processed with CAD. IMPRESSION: No mammographic evidence of malignancy. A result letter of this screening mammogram will be mailed directly to the patient. RECOMMENDATION: Screening mammogram in one year. (Code:SM-B-01Y) BI-RADS CATEGORY  1: Negative. Electronically Signed   By: Fidela Salisbury M.D.   On: 12/27/2015 07:57    Assessment & Plan:   Julia Olson was seen today for annual exam, anemia and hyperlipidemia.  Diagnoses and all orders for this visit:  Other iron deficiency anemia- her H&H and iron level have improved. We'll continue iron replacement therapy. -     CBC with  Differential/Platelet; Future -     IBC panel; Future -     Ferritin; Future -     Ambulatory referral to Gastroenterology  Pernicious anemia- her anemia has improved but she is mildly deficient in B12. -     CBC with Differential/Platelet; Future -     Vitamin B12; Future -     Folate; Future -     Ferritin; Future  Hyperlipidemia with target LDL less than 130- her Framingham risk score is only 4% so I do not recommend that she start a statin for cardiovascular risk reduction. -     Lipid panel; Future -     Comprehensive metabolic panel; Future -     TSH; Future  Routine general medical examination at a health care facility -     Zoster Vaccine Live, PF, (ZOSTAVAX) 60454 UNT/0.65ML injection; Inject 19,400 Units into the skin once.  Screen for colon cancer -     Ambulatory referral to Gastroenterology  Vitamin B12 deficiency neuropathy ([place])- her B12 level is down to 265 and she complains of tingling in her fingers and toes, will start high-dose oral B12 replacement therapy. -     cyanocobalamin 2000 MCG tablet; Take 1 tablet (2,000 mcg  total) by mouth daily.  Need for prophylactic vaccination against Streptococcus pneumoniae (pneumococcus) -     Pneumococcal conjugate vaccine 13-valent   I have discontinued Julia Olson's Ferrous Fumarate-Folic Acid. I am also having her start on Zoster Vaccine Live (PF) and cyanocobalamin. Additionally, I am having her maintain her omeprazole.  Meds ordered this encounter  Medications  . Zoster Vaccine Live, PF, (ZOSTAVAX) [id number] UNT/0.65ML injection    Sig: Inject 19,400 Units into the skin once.    Dispense:  1 each    Refill:  0  . cyanocobalamin 2000 MCG tablet    Sig: Take 1 tablet (2,000 mcg total) by mouth daily.    Dispense:  90 tablet    Refill:  3   See AVS for instructions about healthy living and anticipatory guidance.  Follow-up: Return in about 4 months (around 03/10/2017).  [name], MD

## 2016-11-17 NOTE — Assessment & Plan Note (Signed)

## 2016-11-19 ENCOUNTER — Telehealth: Payer: Self-pay

## 2016-11-19 NOTE — Telephone Encounter (Signed)
This has been faxed as requested

## 2016-11-19 NOTE — Telephone Encounter (Signed)
-----   Message from [facility], [place] sent at 11/10/2016  9:41 AM EST ----- Regarding: labs to eye doctor Celestia Khat O.D.  [address] [place], [place] [address] P: [phone] F: [phone]

## 2016-11-23 ENCOUNTER — Other Ambulatory Visit: Payer: Self-pay | Admitting: Internal Medicine

## 2016-11-23 DIAGNOSIS — K219 Gastro-esophageal reflux disease without esophagitis: Secondary | ICD-10-CM

## 2016-12-24 ENCOUNTER — Encounter: Payer: Self-pay | Admitting: Internal Medicine

## 2017-01-18 ENCOUNTER — Encounter: Payer: Self-pay | Admitting: Internal Medicine

## 2017-06-09 ENCOUNTER — Other Ambulatory Visit: Payer: Self-pay | Admitting: Internal Medicine

## 2017-06-09 DIAGNOSIS — K219 Gastro-esophageal reflux disease without esophagitis: Secondary | ICD-10-CM

## 2017-09-11 LAB — GLUCOSE, POCT (MANUAL RESULT ENTRY): POC Glucose: 85 mg/dl (ref 70–99)

## 2017-09-30 DIAGNOSIS — D2221 Melanocytic nevi of right ear and external auricular canal: Secondary | ICD-10-CM | POA: Diagnosis not present

## 2017-09-30 DIAGNOSIS — Z419 Encounter for procedure for purposes other than remedying health state, unspecified: Secondary | ICD-10-CM | POA: Diagnosis not present

## 2017-09-30 DIAGNOSIS — D2261 Melanocytic nevi of right upper limb, including shoulder: Secondary | ICD-10-CM | POA: Diagnosis not present

## 2017-09-30 DIAGNOSIS — L821 Other seborrheic keratosis: Secondary | ICD-10-CM | POA: Diagnosis not present

## 2017-09-30 DIAGNOSIS — D1801 Hemangioma of skin and subcutaneous tissue: Secondary | ICD-10-CM | POA: Diagnosis not present

## 2017-09-30 DIAGNOSIS — D225 Melanocytic nevi of trunk: Secondary | ICD-10-CM | POA: Diagnosis not present

## 2017-09-30 DIAGNOSIS — D224 Melanocytic nevi of scalp and neck: Secondary | ICD-10-CM | POA: Diagnosis not present

## 2017-09-30 DIAGNOSIS — D2222 Melanocytic nevi of left ear and external auricular canal: Secondary | ICD-10-CM | POA: Diagnosis not present

## 2017-11-16 ENCOUNTER — Telehealth: Payer: Self-pay

## 2017-11-16 ENCOUNTER — Ambulatory Visit (INDEPENDENT_AMBULATORY_CARE_PROVIDER_SITE_OTHER): Payer: Medicare HMO | Admitting: Internal Medicine

## 2017-11-16 ENCOUNTER — Other Ambulatory Visit (INDEPENDENT_AMBULATORY_CARE_PROVIDER_SITE_OTHER): Payer: Medicare HMO

## 2017-11-16 ENCOUNTER — Encounter: Payer: Self-pay | Admitting: Internal Medicine

## 2017-11-16 VITALS — BP 144/84 | HR 67 | Temp 98.2°F | Ht 65.0 in | Wt 171.2 lb

## 2017-11-16 DIAGNOSIS — E785 Hyperlipidemia, unspecified: Secondary | ICD-10-CM

## 2017-11-16 DIAGNOSIS — Z1211 Encounter for screening for malignant neoplasm of colon: Secondary | ICD-10-CM

## 2017-11-16 DIAGNOSIS — Z23 Encounter for immunization: Secondary | ICD-10-CM

## 2017-11-16 DIAGNOSIS — Z0001 Encounter for general adult medical examination with abnormal findings: Secondary | ICD-10-CM | POA: Diagnosis not present

## 2017-11-16 DIAGNOSIS — Z0000 Encounter for general adult medical examination without abnormal findings: Secondary | ICD-10-CM

## 2017-11-16 DIAGNOSIS — D508 Other iron deficiency anemias: Secondary | ICD-10-CM

## 2017-11-16 LAB — IBC PANEL
Iron: 81 ug/dL (ref 42–145)
SATURATION RATIOS: 17.3 % — AB (ref 20.0–50.0)
Transferrin: 334 mg/dL (ref 212.0–360.0)

## 2017-11-16 LAB — COMPREHENSIVE METABOLIC PANEL
ALK PHOS: 75 U/L (ref 39–117)
ALT: 16 U/L (ref 0–35)
AST: 18 U/L (ref 0–37)
Albumin: 4.3 g/dL (ref 3.5–5.2)
BILIRUBIN TOTAL: 0.6 mg/dL (ref 0.2–1.2)
BUN: 15 mg/dL (ref 6–23)
CALCIUM: 9.9 mg/dL (ref 8.4–10.5)
CO2: 28 mEq/L (ref 19–32)
CREATININE: 0.88 mg/dL (ref 0.40–1.20)
Chloride: 104 mEq/L (ref 96–112)
GFR: 68.07 mL/min (ref >60.00)
GLUCOSE: 91 mg/dL (ref 70–99)
Potassium: 4.2 mEq/L (ref 3.5–5.1)
Sodium: 140 mEq/L (ref 135–145)
TOTAL PROTEIN: 7.1 g/dL (ref 6.0–8.3)

## 2017-11-16 LAB — LIPID PANEL
CHOL/HDL RATIO: 5
CHOLESTEROL: 250 mg/dL — AB (ref 0–200)
HDL: 52.5 mg/dL (ref >39.00)
LDL Cholesterol: 170 mg/dL — ABNORMAL HIGH (ref 0–99)
NonHDL: 197.51
TRIGLYCERIDES: 137 mg/dL (ref 0.0–149.0)
VLDL: 27.4 mg/dL (ref 0.0–40.0)

## 2017-11-16 LAB — CBC WITH DIFFERENTIAL/PLATELET
BASOS ABS: 0 10*3/uL (ref 0.0–0.1)
Basophils Relative: 0.7 % (ref 0.0–3.0)
Eosinophils Absolute: 0.3 10*3/uL (ref 0.0–0.7)
Eosinophils Relative: 5.2 % — ABNORMAL HIGH (ref 0.0–5.0)
HCT: 42.5 % (ref 36.0–46.0)
Hemoglobin: 14 g/dL (ref 12.0–15.0)
LYMPHS PCT: 32.2 % (ref 12.0–46.0)
Lymphs Abs: 1.9 10*3/uL (ref 0.7–4.0)
MCHC: 32.9 g/dL (ref 30.0–36.0)
MCV: 86.8 fl (ref 78.0–100.0)
MONOS PCT: 10 % (ref 3.0–12.0)
Monocytes Absolute: 0.6 10*3/uL (ref 0.1–1.0)
Neutro Abs: 3.1 10*3/uL (ref 1.4–7.7)
Neutrophils Relative %: 51.9 % (ref 43.0–77.0)
Platelets: 194 10*3/uL (ref 150.0–400.0)
RBC: 4.9 Mil/uL (ref 3.87–5.11)
RDW: 13.4 % (ref 11.5–15.5)
WBC: 6 10*3/uL (ref 4.0–10.5)

## 2017-11-16 LAB — FERRITIN: Ferritin: 7.3 ng/mL — ABNORMAL LOW (ref 10.0–291.0)

## 2017-11-16 LAB — TSH: TSH: 2.19 u[IU]/mL (ref 0.35–4.50)

## 2017-11-16 NOTE — Progress Notes (Signed)
Subjective:  Patient ID: Julia Olson, female    DOB: Oct 20, 1951  Age: 66 y.o. MRN: [id number]  CC: Annual Exam and Anemia   HPI Julia Olson presents for a CPX.  She has a history of anemia related to B12 deficiency and iron deficiency.  She tells me she is taking a B12 supplement but not an iron supplement.  She complains of fatigue but otherwise feels well today and offers no other complaints.  Past Medical History:  Diagnosis Date  . GERD 08/27/2010  . Headache(784.0) 08/27/2010  . 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 OSTEOARTHRITIS 08/27/2010   Past Surgical History:  Procedure Laterality Date  . ABDOMINAL HYSTERECTOMY      reports that  has never smoked. she has never used smokeless tobacco. She reports that she does not drink alcohol or use drugs. family history includes Alcohol abuse in her other; Arthritis in her maternal aunt and other; Cancer in her other; Diabetes in her other; Heart disease in her other; Hyperlipidemia in her other; Hypertension in her other. No Known Allergies  Outpatient Medications Prior to Visit  Medication Sig Dispense Refill  . cyanocobalamin 2000 MCG tablet Take 1 tablet (2,000 mcg total) by mouth daily. 90 tablet 3  . omeprazole (PRILOSEC) 20 MG capsule take 2 capsules by mouth daily 180 capsule 3   No facility-administered medications prior to visit.     ROS Review of Systems  Constitutional: Positive for fatigue. Negative for activity change, appetite change, chills and diaphoresis.  HENT: Negative.  Negative for trouble swallowing.   Eyes: Negative for visual disturbance.  Respiratory: Negative.  Negative for cough, chest tightness, shortness of breath and wheezing.   Cardiovascular: Negative.  Negative for chest pain, palpitations and leg swelling.  Gastrointestinal: Negative for abdominal pain, blood in stool, constipation, diarrhea, nausea and vomiting.  Endocrine: Negative.   Genitourinary: Negative.  Negative for difficulty  urinating.  Musculoskeletal: Negative.  Negative for arthralgias, back pain and neck pain.  Skin: Negative.  Negative for color change and rash.  Neurological: Negative.  Negative for dizziness, weakness and light-headedness.  Hematological: Negative.  Negative for adenopathy. Does not bruise/bleed easily.  Psychiatric/Behavioral: Negative.     Objective:  BP (!) 144/84 (BP Location: Left Arm, Patient Position: Sitting, Cuff Size: Normal)   Pulse 67   Temp 98.2 F (36.8 C) (Oral)   Ht 5\' 5"  (1.651 m)   Wt 171 lb 4 oz (77.7 kg)   SpO2 98%   BMI 28.50 kg/m   BP Readings from Last 3 Encounters:  11/16/17 (!) 144/84  09/11/17 (!) 145/80  11/10/16 118/74    Wt Readings from Last 3 Encounters:  11/16/17 171 lb 4 oz (77.7 kg)  11/10/16 164 lb 8 oz (74.6 kg)  10/29/15 159 lb (72.1 kg)    Physical Exam  Constitutional: She is oriented to person, place, and time. No distress.  HENT:  Mouth/Throat: Oropharynx is clear and moist. No oropharyngeal exudate.  Eyes: Conjunctivae are normal. Right eye exhibits no discharge. Left eye exhibits no discharge. No scleral icterus.  Neck: Normal range of motion. Neck supple. No JVD present. No thyromegaly present.  Cardiovascular: Normal rate, regular rhythm and normal heart sounds.  No murmur heard. Pulmonary/Chest: Effort normal and breath sounds normal. She has no wheezes. She has no rales.  Abdominal: Soft. Bowel sounds are normal. She exhibits no mass. There is no tenderness. There is no rebound and no guarding.  Musculoskeletal: Normal range of motion. She exhibits no  edema, tenderness or deformity.  Lymphadenopathy:    She has no cervical adenopathy.  Neurological: She is alert and oriented to person, place, and time.  Skin: Skin is warm and dry. No rash noted. She is not diaphoretic. No erythema. No pallor.  Vitals reviewed.   Lab Results  Component Value Date   WBC 6.0 11/16/2017   HGB 14.0 11/16/2017   HCT 42.5 11/16/2017    PLT 194.0 11/16/2017   GLUCOSE 91 11/16/2017   CHOL 250 (H) 11/16/2017   TRIG 137.0 11/16/2017   HDL 52.50 11/16/2017   LDLDIRECT 189.8 11/26/2011   LDLCALC 170 (H) 11/16/2017   ALT 16 11/16/2017   AST 18 11/16/2017   NA 140 11/16/2017   K 4.2 11/16/2017   CL 104 11/16/2017   CREATININE 0.88 11/16/2017   BUN 15 11/16/2017   CO2 28 11/16/2017   TSH 2.19 11/16/2017    Mm Digital Screening Bilateral  Result Date: 12/26/2015 CLINICAL DATA:  Screening. EXAM: DIGITAL SCREENING BILATERAL MAMMOGRAM WITH CAD COMPARISON:  Previous exam(s). ACR Breast Density Category b: There are scattered areas of fibroglandular density. FINDINGS: There are no findings suspicious for malignancy. Images were processed with CAD. IMPRESSION: No mammographic evidence of malignancy. A result letter of this screening mammogram will be mailed directly to the patient. RECOMMENDATION: Screening mammogram in one year. (Code:SM-B-01Y) BI-RADS CATEGORY  1: Negative. Electronically Signed   By: Fidela Salisbury M.D.   On: 12/27/2015 07:57    Assessment & Plan:   Adele was seen today for annual exam and anemia.  Diagnoses and all orders for this visit:  Other iron deficiency anemia- Her H&H are normal but her ferritin is low and her saturation rate is low so I have asked her to start an iron replacement to prevent the development of iron deficiency anemia. -     CBC with Differential/Platelet; Future -     IBC panel; Future -     Ferritin; Future -     ferrous sulfate 325 (65 FE) MG tablet; Take 1 tablet (325 mg total) by mouth daily with breakfast.  Hyperlipidemia with target LDL less than 130-She does not have a significantly elevated ASCVD risk score so I did not recommend that she take a statin for CV risk reduction. -     Comprehensive metabolic panel; Future -     Lipid panel; Future -     TSH; Future  Routine general medical examination at a health care facility  Screen for colon cancer- Cologuard was  ordered to screen for colon cancer/polyps.  Need for pneumococcal vaccination -     Pneumococcal polysaccharide vaccine 23-valent greater than or equal to 2yo subcutaneous/IM   I am having Stefano Gaul start on ferrous sulfate. I am also having her maintain her cyanocobalamin and omeprazole.  Meds ordered this encounter  Medications  . ferrous sulfate 325 (65 FE) MG tablet    Sig: Take 1 tablet (325 mg total) by mouth daily with breakfast.    Dispense:  90 tablet    Refill:  1   See AVS for instructions about healthy living and anticipatory guidance.  Follow-up: Return in about 6 months (around 05/17/2018).  [name], MD

## 2017-11-16 NOTE — Telephone Encounter (Signed)
Order [id number]

## 2017-11-16 NOTE — Patient Instructions (Signed)

## 2017-11-17 ENCOUNTER — Encounter: Payer: Self-pay | Admitting: Internal Medicine

## 2017-11-17 MED ORDER — FERROUS SULFATE 325 (65 FE) MG PO TABS: 325.0000 mg | ORAL_TABLET | Freq: Every day | ORAL | 1 refills | Status: AC

## 2017-11-17 NOTE — Assessment & Plan Note (Signed)

## 2018-02-23 DIAGNOSIS — Z0100 Encounter for examination of eyes and vision without abnormal findings: Secondary | ICD-10-CM | POA: Diagnosis not present

## 2018-02-28 ENCOUNTER — Other Ambulatory Visit: Payer: Self-pay | Admitting: Internal Medicine

## 2018-02-28 DIAGNOSIS — K219 Gastro-esophageal reflux disease without esophagitis: Secondary | ICD-10-CM

## 2018-02-28 DIAGNOSIS — L089 Local infection of the skin and subcutaneous tissue, unspecified: Secondary | ICD-10-CM | POA: Diagnosis not present

## 2018-03-03 ENCOUNTER — Telehealth: Payer: Self-pay | Admitting: Internal Medicine

## 2018-03-03 NOTE — Telephone Encounter (Signed)
Rec'd from [facility] forward 2 pages to Dr.Jones Marcello Moores

## 2018-03-23 NOTE — Telephone Encounter (Signed)
01/15/2018: Cancelled - Suspended for Inactivity

## 2018-06-28 ENCOUNTER — Other Ambulatory Visit: Payer: Self-pay | Admitting: Internal Medicine

## 2018-06-28 DIAGNOSIS — K219 Gastro-esophageal reflux disease without esophagitis: Secondary | ICD-10-CM

## 2018-11-23 ENCOUNTER — Other Ambulatory Visit: Payer: Self-pay | Admitting: Internal Medicine

## 2018-11-23 DIAGNOSIS — K219 Gastro-esophageal reflux disease without esophagitis: Secondary | ICD-10-CM

## 2018-12-10 ENCOUNTER — Other Ambulatory Visit: Payer: Self-pay | Admitting: Internal Medicine

## 2018-12-10 DIAGNOSIS — K219 Gastro-esophageal reflux disease without esophagitis: Secondary | ICD-10-CM

## 2019-01-11 ENCOUNTER — Encounter: Payer: Self-pay | Admitting: Internal Medicine

## 2019-01-11 ENCOUNTER — Other Ambulatory Visit (INDEPENDENT_AMBULATORY_CARE_PROVIDER_SITE_OTHER): Payer: Medicare HMO

## 2019-01-11 ENCOUNTER — Ambulatory Visit (INDEPENDENT_AMBULATORY_CARE_PROVIDER_SITE_OTHER): Payer: Medicare HMO | Admitting: Internal Medicine

## 2019-01-11 VITALS — BP 144/90 | HR 64 | Temp 98.2°F | Ht 65.0 in | Wt 161.8 lb

## 2019-01-11 DIAGNOSIS — I10 Essential (primary) hypertension: Secondary | ICD-10-CM

## 2019-01-11 DIAGNOSIS — Z1239 Encounter for other screening for malignant neoplasm of breast: Secondary | ICD-10-CM | POA: Insufficient documentation

## 2019-01-11 DIAGNOSIS — E785 Hyperlipidemia, unspecified: Secondary | ICD-10-CM

## 2019-01-11 DIAGNOSIS — Z0001 Encounter for general adult medical examination with abnormal findings: Secondary | ICD-10-CM | POA: Diagnosis not present

## 2019-01-11 DIAGNOSIS — K219 Gastro-esophageal reflux disease without esophagitis: Secondary | ICD-10-CM | POA: Diagnosis not present

## 2019-01-11 DIAGNOSIS — Z0000 Encounter for general adult medical examination without abnormal findings: Secondary | ICD-10-CM

## 2019-01-11 DIAGNOSIS — Z1211 Encounter for screening for malignant neoplasm of colon: Secondary | ICD-10-CM | POA: Insufficient documentation

## 2019-01-11 DIAGNOSIS — Z1231 Encounter for screening mammogram for malignant neoplasm of breast: Secondary | ICD-10-CM

## 2019-01-11 DIAGNOSIS — D508 Other iron deficiency anemias: Secondary | ICD-10-CM

## 2019-01-11 DIAGNOSIS — E559 Vitamin D deficiency, unspecified: Secondary | ICD-10-CM | POA: Diagnosis not present

## 2019-01-11 LAB — CBC WITH DIFFERENTIAL/PLATELET
Basophils Absolute: 0.1 10*3/uL (ref 0.0–0.1)
Basophils Relative: 1.2 % (ref 0.0–3.0)
Eosinophils Absolute: 0.1 10*3/uL (ref 0.0–0.7)
Eosinophils Relative: 2.6 % (ref 0.0–5.0)
HCT: 44.4 % (ref 36.0–46.0)
Hemoglobin: 14.6 g/dL (ref 12.0–15.0)
LYMPHS ABS: 1.9 10*3/uL (ref 0.7–4.0)
Lymphocytes Relative: 35.7 % (ref 12.0–46.0)
MCHC: 32.9 g/dL (ref 30.0–36.0)
MCV: 86.7 fl (ref 78.0–100.0)
Monocytes Absolute: 0.5 10*3/uL (ref 0.1–1.0)
Monocytes Relative: 9.3 % (ref 3.0–12.0)
Neutro Abs: 2.7 10*3/uL (ref 1.4–7.7)
Neutrophils Relative %: 51.2 % (ref 43.0–77.0)
Platelets: 191 10*3/uL (ref 150.0–400.0)
RBC: 5.12 Mil/uL — ABNORMAL HIGH (ref 3.87–5.11)
RDW: 13.6 % (ref 11.5–15.5)
WBC: 5.2 10*3/uL (ref 4.0–10.5)

## 2019-01-11 LAB — TSH: TSH: 2.61 u[IU]/mL (ref 0.35–4.50)

## 2019-01-11 LAB — COMPREHENSIVE METABOLIC PANEL
ALT: 16 U/L (ref 0–35)
AST: 17 U/L (ref 0–37)
Albumin: 4.3 g/dL (ref 3.5–5.2)
Alkaline Phosphatase: 72 U/L (ref 39–117)
BUN: 15 mg/dL (ref 6–23)
CHLORIDE: 105 meq/L (ref 96–112)
CO2: 26 mEq/L (ref 19–32)
CREATININE: 0.86 mg/dL (ref 0.40–1.20)
Calcium: 9.8 mg/dL (ref 8.4–10.5)
GFR: 65.54 mL/min (ref >60.00)
Glucose, Bld: 91 mg/dL (ref 70–99)
Potassium: 4.2 mEq/L (ref 3.5–5.1)
Sodium: 142 mEq/L (ref 135–145)
Total Bilirubin: 0.6 mg/dL (ref 0.2–1.2)
Total Protein: 6.6 g/dL (ref 6.0–8.3)

## 2019-01-11 LAB — URINALYSIS, ROUTINE W REFLEX MICROSCOPIC
Bilirubin Urine: NEGATIVE
Hgb urine dipstick: NEGATIVE
Ketones, ur: NEGATIVE
Nitrite: NEGATIVE
PH: 6.5 (ref 5.0–8.0)
RBC / HPF: NONE SEEN (ref 0–?)
Specific Gravity, Urine: 1.02 (ref 1.000–1.030)
TOTAL PROTEIN, URINE-UPE24: NEGATIVE
Urine Glucose: NEGATIVE
Urobilinogen, UA: 0.2 (ref 0.0–1.0)

## 2019-01-11 LAB — LIPID PANEL
Cholesterol: 252 mg/dL — ABNORMAL HIGH (ref 0–200)
HDL: 49.3 mg/dL (ref >39.00)
LDL Cholesterol: 176 mg/dL — ABNORMAL HIGH (ref 0–99)
NONHDL: 202.76
Total CHOL/HDL Ratio: 5
Triglycerides: 135 mg/dL (ref 0.0–149.0)
VLDL: 27 mg/dL (ref 0.0–40.0)

## 2019-01-11 LAB — VITAMIN D 25 HYDROXY (VIT D DEFICIENCY, FRACTURES): VITD: 26.7 ng/mL — ABNORMAL LOW (ref 30.00–100.00)

## 2019-01-11 LAB — IBC PANEL
Iron: 117 ug/dL (ref 42–145)
Saturation Ratios: 26.4 % (ref 20.0–50.0)
Transferrin: 316 mg/dL (ref 212.0–360.0)

## 2019-01-11 LAB — FERRITIN: Ferritin: 9.7 ng/mL — ABNORMAL LOW (ref 10.0–291.0)

## 2019-01-11 MED ORDER — CHOLECALCIFEROL 50 MCG (2000 UT) PO TABS: 2.0000 | ORAL_TABLET | Freq: Every day | ORAL | 1 refills | Status: AC

## 2019-01-11 MED ORDER — OMEPRAZOLE 20 MG PO CPDR: 40.0000 mg | DELAYED_RELEASE_CAPSULE | Freq: Every day | ORAL | 0 refills | Status: DC

## 2019-01-11 NOTE — Progress Notes (Signed)
Subjective:  Patient ID: Julia Olson, female    DOB: [birth year]  Age: 68 y.o. MRN: [id number]  CC: Annual Exam and Hypertension   HPI Julia Olson presents for a CPX.  She has intentionally lost weight over the last year with lifestyle modifications. She denies HA/BV/CP/DOE/edema/fatigue. She tells her husband checks her BP at home and it is usually around 120/80.  Past Medical History:  Diagnosis Date  . GERD 08/27/2010  . Headache(784.0) 08/27/2010  . 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 OSTEOARTHRITIS 08/27/2010   Past Surgical History:  Procedure Laterality Date  . ABDOMINAL HYSTERECTOMY      reports that she has never smoked. She has never used smokeless tobacco. She reports that she does not drink alcohol or use drugs. family history includes Alcohol abuse in an other family member; Arthritis in her maternal aunt and another family member; Cancer in an other family member; Diabetes in an other family member; Heart disease in an other family member; Hyperlipidemia in an other family member; Hypertension in an other family member. No Known Allergies  Outpatient Medications Prior to Visit  Medication Sig Dispense Refill  . cyanocobalamin 2000 MCG tablet Take 1 tablet (2,000 mcg total) by mouth daily. 90 tablet 3  . ferrous sulfate 325 (65 FE) MG tablet Take 1 tablet (325 mg total) by mouth daily with breakfast. 90 tablet 1  . omeprazole (PRILOSEC) 20 MG capsule TAKE TWO CAPSULES BY MOUTH DAILY  180 capsule 0   No facility-administered medications prior to visit.     ROS Review of Systems  Constitutional: Negative.  Negative for appetite change, diaphoresis, fatigue and unexpected weight change.  HENT: Negative.   Respiratory: Negative for apnea, cough, chest tightness, shortness of breath and wheezing.   Cardiovascular: Negative for chest pain, palpitations and leg swelling.  Gastrointestinal: Negative for abdominal pain, constipation, diarrhea, nausea and vomiting.    Endocrine: Negative.   Genitourinary: Negative.  Negative for decreased urine volume, difficulty urinating, dysuria, hematuria and urgency.  Musculoskeletal: Negative.  Negative for arthralgias and myalgias.  Skin: Negative.  Negative for color change, pallor and rash.  Neurological: Negative.  Negative for dizziness, weakness, light-headedness, numbness and headaches.  Hematological: Negative for adenopathy. Does not bruise/bleed easily.  Psychiatric/Behavioral: Negative.     Objective:  BP (!) 144/90 (BP Location: Left Arm, Patient Position: Sitting, Cuff Size: Normal)   Pulse 64   Temp 98.2 F (36.8 C) (Oral)   Ht 5\' 5"  (1.651 m)   Wt 161 lb 12 oz (73.4 kg)   SpO2 98%   BMI 26.92 kg/m   BP Readings from Last 3 Encounters:  01/11/19 (!) 144/90  11/16/17 (!) 144/84  09/11/17 (!) 145/80    Wt Readings from Last 3 Encounters:  01/11/19 161 lb 12 oz (73.4 kg)  11/16/17 171 lb 4 oz (77.7 kg)  11/10/16 164 lb 8 oz (74.6 kg)    Physical Exam Vitals signs reviewed.  Constitutional:      Appearance: She is not ill-appearing or diaphoretic.  HENT:     Nose: Nose normal. No congestion or rhinorrhea.     Mouth/Throat:     Mouth: Mucous membranes are moist.     Pharynx: Oropharynx is clear. No oropharyngeal exudate or posterior oropharyngeal erythema.  Eyes:     General: No scleral icterus.    Conjunctiva/sclera: Conjunctivae normal.  Neck:     Musculoskeletal: Normal range of motion and neck supple. No neck rigidity or muscular tenderness.  Vascular: No carotid bruit.  Cardiovascular:     Rate and Rhythm: Normal rate and regular rhythm.     Heart sounds: No murmur.     Comments: EKG ---  Sinus  Rhythm  WITHIN NORMAL LIMITS Pulmonary:     Effort: Pulmonary effort is normal.     Breath sounds: No stridor. No wheezing, rhonchi or rales.  Abdominal:     General: Abdomen is flat.     Palpations: There is no hepatomegaly, splenomegaly or mass.     Tenderness: There is  no abdominal tenderness. There is no guarding.  Musculoskeletal: Normal range of motion.        General: No swelling.     Right lower leg: No edema.     Left lower leg: No edema.  Lymphadenopathy:     Cervical: No cervical adenopathy.  Skin:    General: Skin is warm.     Coloration: Skin is not pale.     Findings: No erythema or rash.  Neurological:     General: No focal deficit present.     Mental Status: She is oriented to person, place, and time. Mental status is at baseline.     Lab Results  Component Value Date   WBC 5.2 01/11/2019   HGB 14.6 01/11/2019   HCT 44.4 01/11/2019   PLT 191.0 01/11/2019   GLUCOSE 91 01/11/2019   CHOL 252 (H) 01/11/2019   TRIG 135.0 01/11/2019   HDL 49.30 01/11/2019   LDLDIRECT 189.8 11/26/2011   LDLCALC 176 (H) 01/11/2019   ALT 16 01/11/2019   AST 17 01/11/2019   NA 142 01/11/2019   K 4.2 01/11/2019   CL 105 01/11/2019   CREATININE 0.86 01/11/2019   BUN 15 01/11/2019   CO2 26 01/11/2019   TSH 2.61 01/11/2019    Mm Digital Screening Bilateral  Result Date: 12/26/2015 CLINICAL DATA:  Screening. EXAM: DIGITAL SCREENING BILATERAL MAMMOGRAM WITH CAD COMPARISON:  Previous exam(s). ACR Breast Density Category b: There are scattered areas of fibroglandular density. FINDINGS: There are no findings suspicious for malignancy. Images were processed with CAD. IMPRESSION: No mammographic evidence of malignancy. A result letter of this screening mammogram will be mailed directly to the patient. RECOMMENDATION: Screening mammogram in one year. (Code:SM-B-01Y) BI-RADS CATEGORY  1: Negative. Electronically Signed   By: Fidela Salisbury M.D.   On: 12/27/2015 07:57    Assessment & Plan:   Julia Olson was seen today for annual exam and hypertension.  Diagnoses and all orders for this visit:  Hyperlipidemia with target LDL less than 130-  She does not have an elevated ASCVD risk score so I do not recommend a statin for CV risk reduction. -      Comprehensive metabolic panel; Future -     Lipid panel; Future -     TSH; Future  Other iron deficiency anemia- Her H/H and iron levels are normal now. -     CBC with Differential/Platelet; Future -     IBC panel; Future -     Ferritin; Future  Visit for screening mammogram -     MM DIGITAL SCREENING BILATERAL; Future  Routine general medical examination at a health care facility  Gastroesophageal reflux disease without esophagitis- Her sx's are well controlled with the PPI. -     omeprazole (PRILOSEC) 20 MG capsule; Take 2 capsules (40 mg total) by mouth daily.  Breast cancer screening  Colon cancer screening -     Cologuard  Hypertension, unspecified type- Her BP  is elevated today. Her EKG is negative for LVH. Her labs are negative for seconday causes or end organ damage. She is not will to take an antihypertensive and this sounds like white coat HTN so I did not prescribe an antihypertensive. -     EKG 12-Lead -     VITAMIN D 25 Hydroxy (Vit-D Deficiency, Fractures); Future -     Urinalysis, Routine w reflex microscopic; Future  Vitamin D deficiency disease- Will start a Vit D supplement. -     Cholecalciferol 50 MCG (2000 UT) TABS; Take 2 tablets (4,000 Units total) by mouth daily.   I have changed Ivin Booty Saling's omeprazole. I am also having her start on Cholecalciferol. Additionally, I am having her maintain her cyanocobalamin and ferrous sulfate.  Meds ordered this encounter  Medications  . omeprazole (PRILOSEC) 20 MG capsule    Sig: Take 2 capsules (40 mg total) by mouth daily.    Dispense:  180 capsule    Refill:  0  . Cholecalciferol 50 MCG (2000 UT) TABS    Sig: Take 2 tablets (4,000 Units total) by mouth daily.    Dispense:  180 tablet    Refill:  1    See AVS for instructions about healthy living and anticipatory guidance.   Follow-up: Return in about 6 months (around 07/12/2019).  [name], MD

## 2019-01-11 NOTE — Patient Instructions (Signed)

## 2019-01-12 NOTE — Assessment & Plan Note (Signed)

## 2019-01-18 ENCOUNTER — Telehealth: Payer: Self-pay

## 2019-01-18 NOTE — Telephone Encounter (Signed)
Key: [id number]

## 2019-01-24 NOTE — Telephone Encounter (Signed)
PA has been approved.   My chart message sent to pt informing of same.

## 2019-03-01 ENCOUNTER — Inpatient Hospital Stay: Admission: RE | Admit: 2019-03-01 | Payer: Medicare HMO | Source: Ambulatory Visit

## 2019-03-01 DIAGNOSIS — J309 Allergic rhinitis, unspecified: Secondary | ICD-10-CM | POA: Diagnosis not present

## 2019-03-01 DIAGNOSIS — H6092 Unspecified otitis externa, left ear: Secondary | ICD-10-CM | POA: Diagnosis not present

## 2019-03-01 DIAGNOSIS — H6122 Impacted cerumen, left ear: Secondary | ICD-10-CM | POA: Diagnosis not present

## 2019-03-05 DIAGNOSIS — H6691 Otitis media, unspecified, right ear: Secondary | ICD-10-CM | POA: Diagnosis not present

## 2019-05-24 ENCOUNTER — Other Ambulatory Visit: Payer: Self-pay | Admitting: Internal Medicine

## 2019-05-24 DIAGNOSIS — K219 Gastro-esophageal reflux disease without esophagitis: Secondary | ICD-10-CM

## 2019-08-24 DIAGNOSIS — R69 Illness, unspecified: Secondary | ICD-10-CM | POA: Diagnosis not present

## 2019-12-27 ENCOUNTER — Other Ambulatory Visit: Payer: Self-pay

## 2019-12-27 ENCOUNTER — Ambulatory Visit
Admission: RE | Admit: 2019-12-27 | Discharge: 2019-12-27 | Disposition: A | Discharge status: Home / Self Care | Payer: Medicare HMO | Source: Ambulatory Visit | Attending: Internal Medicine | Admitting: Internal Medicine

## 2019-12-27 DIAGNOSIS — Z1231 Encounter for screening mammogram for malignant neoplasm of breast: Secondary | ICD-10-CM

## 2019-12-27 LAB — HM MAMMOGRAPHY

## 2020-02-17 DIAGNOSIS — H5210 Myopia, unspecified eye: Secondary | ICD-10-CM | POA: Diagnosis not present

## 2020-02-17 DIAGNOSIS — Z135 Encounter for screening for eye and ear disorders: Secondary | ICD-10-CM | POA: Diagnosis not present

## 2020-02-17 DIAGNOSIS — H2513 Age-related nuclear cataract, bilateral: Secondary | ICD-10-CM | POA: Diagnosis not present

## 2020-02-17 DIAGNOSIS — H02831 Dermatochalasis of right upper eyelid: Secondary | ICD-10-CM | POA: Diagnosis not present

## 2020-02-17 DIAGNOSIS — H02834 Dermatochalasis of left upper eyelid: Secondary | ICD-10-CM | POA: Diagnosis not present

## 2020-02-17 DIAGNOSIS — H18413 Arcus senilis, bilateral: Secondary | ICD-10-CM | POA: Diagnosis not present

## 2020-03-19 DIAGNOSIS — H57813 Brow ptosis, bilateral: Secondary | ICD-10-CM | POA: Diagnosis not present

## 2020-03-19 DIAGNOSIS — H02423 Myogenic ptosis of bilateral eyelids: Secondary | ICD-10-CM | POA: Diagnosis not present

## 2020-03-19 DIAGNOSIS — H02831 Dermatochalasis of right upper eyelid: Secondary | ICD-10-CM | POA: Diagnosis not present

## 2020-03-19 DIAGNOSIS — H02413 Mechanical ptosis of bilateral eyelids: Secondary | ICD-10-CM | POA: Diagnosis not present

## 2020-03-19 DIAGNOSIS — H53483 Generalized contraction of visual field, bilateral: Secondary | ICD-10-CM | POA: Diagnosis not present

## 2020-03-19 DIAGNOSIS — H02834 Dermatochalasis of left upper eyelid: Secondary | ICD-10-CM | POA: Diagnosis not present

## 2020-03-19 DIAGNOSIS — D485 Neoplasm of uncertain behavior of skin: Secondary | ICD-10-CM | POA: Diagnosis not present

## 2020-05-07 DIAGNOSIS — J018 Other acute sinusitis: Secondary | ICD-10-CM | POA: Diagnosis not present

## 2020-10-09 ENCOUNTER — Encounter: Payer: Self-pay | Admitting: Internal Medicine

## 2020-10-09 ENCOUNTER — Ambulatory Visit (INDEPENDENT_AMBULATORY_CARE_PROVIDER_SITE_OTHER): Payer: Medicare HMO | Admitting: Internal Medicine

## 2020-10-09 ENCOUNTER — Other Ambulatory Visit: Payer: Self-pay

## 2020-10-09 VITALS — BP 116/78 | HR 65 | Temp 98.5°F | Ht 65.0 in | Wt 175.0 lb

## 2020-10-09 DIAGNOSIS — H9313 Tinnitus, bilateral: Secondary | ICD-10-CM | POA: Diagnosis not present

## 2020-10-09 DIAGNOSIS — I10 Essential (primary) hypertension: Secondary | ICD-10-CM | POA: Diagnosis not present

## 2020-10-09 DIAGNOSIS — E785 Hyperlipidemia, unspecified: Secondary | ICD-10-CM | POA: Diagnosis not present

## 2020-10-09 DIAGNOSIS — E559 Vitamin D deficiency, unspecified: Secondary | ICD-10-CM

## 2020-10-09 DIAGNOSIS — K219 Gastro-esophageal reflux disease without esophagitis: Secondary | ICD-10-CM | POA: Diagnosis not present

## 2020-10-09 DIAGNOSIS — Z0000 Encounter for general adult medical examination without abnormal findings: Secondary | ICD-10-CM | POA: Diagnosis not present

## 2020-10-09 DIAGNOSIS — Z23 Encounter for immunization: Secondary | ICD-10-CM

## 2020-10-09 DIAGNOSIS — Z1211 Encounter for screening for malignant neoplasm of colon: Secondary | ICD-10-CM

## 2020-10-09 DIAGNOSIS — D508 Other iron deficiency anemias: Secondary | ICD-10-CM

## 2020-10-09 LAB — HEPATIC FUNCTION PANEL
ALT: 16 U/L (ref 0–35)
AST: 19 U/L (ref 0–37)
Albumin: 4.2 g/dL (ref 3.5–5.2)
Alkaline Phosphatase: 80 U/L (ref 39–117)
Bilirubin, Direct: 0 mg/dL (ref 0.0–0.3)
Total Bilirubin: 0.6 mg/dL (ref 0.2–1.2)
Total Protein: 7 g/dL (ref 6.0–8.3)

## 2020-10-09 LAB — CBC WITH DIFFERENTIAL/PLATELET
Basophils Absolute: 0.1 10*3/uL (ref 0.0–0.1)
Basophils Relative: 1.1 % (ref 0.0–3.0)
Eosinophils Absolute: 0.2 10*3/uL (ref 0.0–0.7)
Eosinophils Relative: 3 % (ref 0.0–5.0)
HCT: 43.1 % (ref 36.0–46.0)
Hemoglobin: 14.1 g/dL (ref 12.0–15.0)
Lymphocytes Relative: 35.9 % (ref 12.0–46.0)
Lymphs Abs: 2 10*3/uL (ref 0.7–4.0)
MCHC: 32.7 g/dL (ref 30.0–36.0)
MCV: 87 fl (ref 78.0–100.0)
Monocytes Absolute: 0.5 10*3/uL (ref 0.1–1.0)
Monocytes Relative: 9.4 % (ref 3.0–12.0)
Neutro Abs: 2.9 10*3/uL (ref 1.4–7.7)
Neutrophils Relative %: 50.6 % (ref 43.0–77.0)
Platelets: 219 10*3/uL (ref 150.0–400.0)
RBC: 4.95 Mil/uL (ref 3.87–5.11)
RDW: 13.7 % (ref 11.5–15.5)
WBC: 5.6 10*3/uL (ref 4.0–10.5)

## 2020-10-09 LAB — BASIC METABOLIC PANEL
BUN: 12 mg/dL (ref 6–23)
CO2: 31 mEq/L (ref 19–32)
Calcium: 10 mg/dL (ref 8.4–10.5)
Chloride: 104 mEq/L (ref 96–112)
Creatinine, Ser: 0.94 mg/dL (ref 0.40–1.20)
GFR: 61.63 mL/min (ref >60.00)
Glucose, Bld: 82 mg/dL (ref 70–99)
Potassium: 4.4 mEq/L (ref 3.5–5.1)
Sodium: 141 mEq/L (ref 135–145)

## 2020-10-09 LAB — LIPID PANEL
Cholesterol: 265 mg/dL — ABNORMAL HIGH (ref 0–200)
HDL: 51.5 mg/dL (ref >39.00)
LDL Cholesterol: 185 mg/dL — ABNORMAL HIGH (ref 0–99)
NonHDL: 213.59
Total CHOL/HDL Ratio: 5
Triglycerides: 143 mg/dL (ref 0.0–149.0)
VLDL: 28.6 mg/dL (ref 0.0–40.0)

## 2020-10-09 LAB — VITAMIN D 25 HYDROXY (VIT D DEFICIENCY, FRACTURES): VITD: 61.23 ng/mL (ref 30.00–100.00)

## 2020-10-09 LAB — TSH: TSH: 3.31 u[IU]/mL (ref 0.35–4.50)

## 2020-10-09 MED ORDER — OMEPRAZOLE 20 MG PO CPDR: 40.0000 mg | DELAYED_RELEASE_CAPSULE | Freq: Every day | ORAL | 1 refills | Status: DC

## 2020-10-09 NOTE — Patient Instructions (Signed)
Health Maintenance, Female Adopting a healthy lifestyle and getting preventive care are important in promoting health and wellness. Ask your health care provider about:  The right schedule for you to have regular tests and exams.  Things you can do on your own to prevent diseases and keep yourself healthy. What should I know about diet, weight, and exercise? Eat a healthy diet   Eat a diet that includes plenty of vegetables, fruits, low-fat dairy products, and lean protein.  Do not eat a lot of foods that are high in solid fats, added sugars, or sodium. Maintain a healthy weight Body mass index (BMI) is used to identify weight problems. It estimates body fat based on height and weight. Your health care provider can help determine your BMI and help you achieve or maintain a healthy weight. Get regular exercise Get regular exercise. This is one of the most important things you can do for your health. Most adults should:  Exercise for at least 150 minutes each week. The exercise should increase your heart rate and make you sweat (moderate-intensity exercise).  Do strengthening exercises at least twice a week. This is in addition to the moderate-intensity exercise.  Spend less time sitting. Even light physical activity can be beneficial. Watch cholesterol and blood lipids Have your blood tested for lipids and cholesterol at 70 years of age, then have this test every 5 years. Have your cholesterol levels checked more often if:  Your lipid or cholesterol levels are high.  You are older than 70 years of age.  You are at high risk for heart disease. What should I know about cancer screening? Depending on your health history and family history, you may need to have cancer screening at various ages. This may include screening for:  Breast cancer.  Cervical cancer.  Colorectal cancer.  Skin cancer.  Lung cancer. What should I know about heart disease, diabetes, and high blood  pressure? Blood pressure and heart disease  High blood pressure causes heart disease and increases the risk of stroke. This is more likely to develop in people who have high blood pressure readings, are of African descent, or are overweight.  Have your blood pressure checked: ? Every 3-5 years if you are 18-39 years of age. ? Every year if you are 40 years old or older. Diabetes Have regular diabetes screenings. This checks your fasting blood sugar level. Have the screening done:  Once every three years after age 40 if you are at a normal weight and have a low risk for diabetes.  More often and at a younger age if you are overweight or have a high risk for diabetes. What should I know about preventing infection? Hepatitis B If you have a higher risk for hepatitis B, you should be screened for this virus. Talk with your health care provider to find out if you are at risk for hepatitis B infection. Hepatitis C Testing is recommended for:  Everyone born from 1945 through 1965.  Anyone with known risk factors for hepatitis C. Sexually transmitted infections (STIs)  Get screened for STIs, including gonorrhea and chlamydia, if: ? You are sexually active and are younger than 70 years of age. ? You are older than 70 years of age and your health care provider tells you that you are at risk for this type of infection. ? Your sexual activity has changed since you were last screened, and you are at increased risk for chlamydia or gonorrhea. Ask your health care provider if   you are at risk.  Ask your health care provider about whether you are at high risk for HIV. Your health care provider may recommend a prescription medicine to help prevent HIV infection. If you choose to take medicine to prevent HIV, you should first get tested for HIV. You should then be tested every 3 months for as long as you are taking the medicine. Pregnancy  If you are about to stop having your period (premenopausal) and  you may become pregnant, seek counseling before you get pregnant.  Take 400 to 800 micrograms (mcg) of folic acid every day if you become pregnant.  Ask for birth control (contraception) if you want to prevent pregnancy. Osteoporosis and menopause Osteoporosis is a disease in which the bones lose minerals and strength with aging. This can result in bone fractures. If you are 65 years old or older, or if you are at risk for osteoporosis and fractures, ask your health care provider if you should:  Be screened for bone loss.  Take a calcium or vitamin D supplement to lower your risk of fractures.  Be given hormone replacement therapy (HRT) to treat symptoms of menopause. Follow these instructions at home: Lifestyle  Do not use any products that contain nicotine or tobacco, such as cigarettes, e-cigarettes, and chewing tobacco. If you need help quitting, ask your health care provider.  Do not use street drugs.  Do not share needles.  Ask your health care provider for help if you need support or information about quitting drugs. Alcohol use  Do not drink alcohol if: ? Your health care provider tells you not to drink. ? You are pregnant, may be pregnant, or are planning to become pregnant.  If you drink alcohol: ? Limit how much you use to 0-1 drink a day. ? Limit intake if you are breastfeeding.  Be aware of how much alcohol is in your drink. In the [place] one drink equals one 12 oz bottle of beer (355 mL), one 5 oz glass of wine (148 mL), or one 1 oz glass of hard liquor (44 mL). General instructions  Schedule regular health, dental, and eye exams.  Stay current with your vaccines.  Tell your health care provider if: ? You often feel depressed. ? You have ever been abused or do not feel safe at home. Summary  Adopting a healthy lifestyle and getting preventive care are important in promoting health and wellness.  Follow your health care provider's instructions about healthy  diet, exercising, and getting tested or screened for diseases.  Follow your health care provider's instructions on monitoring your cholesterol and blood pressure. This information is not intended to replace advice given to you by your health care provider. Make sure you discuss any questions you have with your health care provider. Document Revised: 11/23/2018 Document Reviewed: 11/23/2018 Elsevier Patient Education  2020 [employer]vier Inc[employer]  

## 2020-10-09 NOTE — Progress Notes (Signed)
Subjective:  Patient ID: Julia Olson, female    DOB: [birth year]  Age: 69 y.o. MRN: [id number]  CC: Annual Exam, Hypertension, and Hyperlipidemia  This visit occurred during the SARS-CoV-2 public health emergency.  Safety protocols were in place, including screening questions prior to the visit, additional usage of staff PPE, and extensive cleaning of exam room while observing appropriate contact time as indicated for disinfecting solutions.    HPI Julia Olson presents for a CPX.  She tells me her heartburn is well controlled.  She would like to continue taking the PPI.  She denies odynophagia or dysphagia.  She is controlling her blood pressure with lifestyle modifications.  She is active and denies any recent episodes of chest pain, shortness of breath, palpitations, edema, or fatigue.  Outpatient Medications Prior to Visit  Medication Sig Dispense Refill  . Cholecalciferol 50 MCG (2000 UT) TABS Take 2 tablets (4,000 Units total) by mouth daily. 180 tablet 1  . cyanocobalamin 2000 MCG tablet Take 1 tablet (2,000 mcg total) by mouth daily. 90 tablet 3  . ferrous sulfate 325 (65 FE) MG tablet Take 1 tablet (325 mg total) by mouth daily with breakfast. 90 tablet 1  . omeprazole (PRILOSEC) 20 MG capsule TAKE TWO CAPSULES BY MOUTH DAILY  180 capsule 1   No facility-administered medications prior to visit.    ROS Review of Systems  Constitutional: Negative for appetite change, diaphoresis, fatigue and unexpected weight change.  HENT: Positive for tinnitus.   Eyes: Negative for visual disturbance.  Respiratory: Negative for cough, chest tightness, shortness of breath and wheezing.   Cardiovascular: Negative for chest pain, palpitations and leg swelling.  Gastrointestinal: Negative for abdominal pain, constipation, diarrhea, nausea and vomiting.  Endocrine: Negative.   Genitourinary: Negative.  Negative for difficulty urinating.  Musculoskeletal: Negative.  Negative for  arthralgias.  Skin: Negative.  Negative for color change and pallor.  Neurological: Negative.  Negative for dizziness, weakness, light-headedness, numbness and headaches.  Hematological: Negative for adenopathy. Does not bruise/bleed easily.  Psychiatric/Behavioral: Negative.     Objective:  BP 116/78   Pulse 65   Temp 98.5 F (36.9 C) (Oral)   Ht 5\' 5"  (1.651 m)   Wt 175 lb (79.4 kg)   SpO2 98%   BMI 29.12 kg/m   BP Readings from Last 3 Encounters:  10/09/20 116/78  01/11/19 (!) 144/90  11/16/17 (!) 144/84    Wt Readings from Last 3 Encounters:  10/09/20 175 lb (79.4 kg)  01/11/19 161 lb 12 oz (73.4 kg)  11/16/17 171 lb 4 oz (77.7 kg)    Physical Exam Vitals reviewed.  HENT:     Head: Normocephalic.     Right Ear: Hearing, tympanic membrane, ear canal and external ear normal.     Left Ear: Hearing, tympanic membrane, ear canal and external ear normal.     Nose: Nose normal.     Mouth/Throat:     Mouth: Mucous membranes are moist.  Eyes:     General: No scleral icterus.    Conjunctiva/sclera: Conjunctivae normal.  Cardiovascular:     Rate and Rhythm: Normal rate and regular rhythm.     Heart sounds: No murmur heard.   Pulmonary:     Effort: Pulmonary effort is normal.     Breath sounds: No stridor. No wheezing, rhonchi or rales.  Abdominal:     General: Abdomen is flat.     Palpations: There is no mass.     Tenderness: There is no abdominal  tenderness. There is no guarding.  Musculoskeletal:        General: Normal range of motion.     Cervical back: Neck supple.     Right lower leg: No edema.     Left lower leg: No edema.  Lymphadenopathy:     Cervical: No cervical adenopathy.  Skin:    General: Skin is warm and dry.     Coloration: Skin is not pale.  Neurological:     General: No focal deficit present.     Mental Status: She is alert.  Psychiatric:        Mood and Affect: Mood normal.        Behavior: Behavior normal.     Lab Results    Component Value Date   WBC 5.6 10/09/2020   HGB 14.1 10/09/2020   HCT 43.1 10/09/2020   PLT 219.0 10/09/2020   GLUCOSE 82 10/09/2020   CHOL 265 (H) 10/09/2020   TRIG 143.0 10/09/2020   HDL 51.50 10/09/2020   LDLDIRECT 189.8 11/26/2011   LDLCALC 185 (H) 10/09/2020   ALT 16 10/09/2020   AST 19 10/09/2020   NA 141 10/09/2020   K 4.4 10/09/2020   CL 104 10/09/2020   CREATININE 0.94 10/09/2020   BUN 12 10/09/2020   CO2 31 10/09/2020   TSH 3.31 10/09/2020    MM DIGITAL SCREENING BILATERAL  Result Date: 12/27/2019 CLINICAL DATA:  Screening. EXAM: DIGITAL SCREENING BILATERAL MAMMOGRAM WITH CAD COMPARISON:  Previous exam(s). ACR Breast Density Category b: There are scattered areas of fibroglandular density. FINDINGS: There are no findings suspicious for malignancy. Images were processed with CAD. IMPRESSION: No mammographic evidence of malignancy. A result letter of this screening mammogram will be mailed directly to the patient. RECOMMENDATION: Screening mammogram in one year. (Code:SM-B-01Y) BI-RADS CATEGORY  1: Negative. Electronically Signed   By: Kristopher Oppenheim M.D.   On: 12/27/2019 11:23    Assessment & Plan:   Ramisa was seen today for annual exam, hypertension and hyperlipidemia.  Diagnoses and all orders for this visit:  Hypertension, unspecified type- Her blood pressure is adequately well controlled with lifestyle modifications.  Labs are negative for secondary causes or endorgan damage.  Antihypertensive therapy is not indicated. -     CBC with Differential/Platelet; Future -     Basic metabolic panel; Future -     TSH; Future -     Amb Referral to Nutrition and Diabetic E -     TSH -     Basic metabolic panel -     CBC with Differential/Platelet  Hyperlipidemia with target LDL less than 130- Her ASCVD risk score is less than 10% so I did not recommend a statin for CV risk reduction. -     Lipid panel; Future -     Hepatic function panel; Future -     TSH; Future -      TSH -     Hepatic function panel -     Lipid panel  Other iron deficiency anemia- Her H&H are normal now. -     CBC with Differential/Platelet; Future -     CBC with Differential/Platelet  Colon cancer screening -     Cologuard  Routine general medical examination at a health care facility- Exam completed, labs reviewed, vaccines reviewed and updated, cancer screenings addressed, patient education was given.  Vitamin D deficiency disease- Her vitamin D level is normal now. -     VITAMIN D 25 Hydroxy (Vit-D Deficiency, Fractures); Future -  VITAMIN D 25 Hydroxy (Vit-D Deficiency, Fractures)  Flu vaccine need -     Flu Vaccine QUAD High Dose(Fluad)  Tinnitus aurium, bilateral -     Ambulatory referral to ENT  Gastroesophageal reflux disease without esophagitis -     omeprazole (PRILOSEC) 20 MG capsule; Take 2 capsules (40 mg total) by mouth daily.  Other orders -     Tdap vaccine greater than or equal to 7yo IM   I have changed Ivin Booty Abshier's omeprazole. I am also having her maintain her cyanocobalamin, ferrous sulfate, and Cholecalciferol.  Meds ordered this encounter  Medications  . omeprazole (PRILOSEC) 20 MG capsule    Sig: Take 2 capsules (40 mg total) by mouth daily.    Dispense:  180 capsule    Refill:  1     Follow-up: Return in about 1 year (around 10/09/2021).  [name], MD

## 2020-10-12 DIAGNOSIS — Z1211 Encounter for screening for malignant neoplasm of colon: Secondary | ICD-10-CM | POA: Diagnosis not present

## 2020-10-28 LAB — COLOGUARD
COLOGUARD: NEGATIVE
Cologuard: NEGATIVE

## 2020-10-28 LAB — EXTERNAL GENERIC LAB PROCEDURE: COLOGUARD: NEGATIVE

## 2020-11-11 ENCOUNTER — Ambulatory Visit: Payer: Medicare HMO | Admitting: Skilled Nursing Facility1

## 2020-11-25 DIAGNOSIS — H903 Sensorineural hearing loss, bilateral: Secondary | ICD-10-CM | POA: Diagnosis not present

## 2020-11-25 DIAGNOSIS — H9313 Tinnitus, bilateral: Secondary | ICD-10-CM | POA: Diagnosis not present

## 2020-12-30 ENCOUNTER — Ambulatory Visit: Payer: Medicare HMO | Admitting: Registered"

## 2021-04-03 DIAGNOSIS — M65872 Other synovitis and tenosynovitis, left ankle and foot: Secondary | ICD-10-CM | POA: Diagnosis not present

## 2021-04-03 DIAGNOSIS — M25572 Pain in left ankle and joints of left foot: Secondary | ICD-10-CM | POA: Diagnosis not present

## 2021-04-03 DIAGNOSIS — M722 Plantar fascial fibromatosis: Secondary | ICD-10-CM | POA: Diagnosis not present

## 2021-04-03 DIAGNOSIS — M79672 Pain in left foot: Secondary | ICD-10-CM | POA: Diagnosis not present

## 2021-04-15 ENCOUNTER — Other Ambulatory Visit: Payer: Self-pay | Admitting: Internal Medicine

## 2021-04-15 DIAGNOSIS — K219 Gastro-esophageal reflux disease without esophagitis: Secondary | ICD-10-CM

## 2021-04-18 DIAGNOSIS — M65872 Other synovitis and tenosynovitis, left ankle and foot: Secondary | ICD-10-CM | POA: Diagnosis not present

## 2021-04-18 DIAGNOSIS — M722 Plantar fascial fibromatosis: Secondary | ICD-10-CM | POA: Diagnosis not present

## 2021-04-30 DIAGNOSIS — H524 Presbyopia: Secondary | ICD-10-CM | POA: Diagnosis not present

## 2021-05-10 DIAGNOSIS — H52221 Regular astigmatism, right eye: Secondary | ICD-10-CM | POA: Diagnosis not present

## 2021-05-10 DIAGNOSIS — H524 Presbyopia: Secondary | ICD-10-CM | POA: Diagnosis not present

## 2021-05-15 ENCOUNTER — Telehealth: Payer: Self-pay

## 2021-05-15 NOTE — Telephone Encounter (Signed)
Approved  03/14/2021 - 12/13/2021

## 2021-05-15 NOTE — Telephone Encounter (Signed)
Key: [id number]

## 2021-07-14 ENCOUNTER — Other Ambulatory Visit: Payer: Self-pay | Admitting: Internal Medicine

## 2021-07-14 DIAGNOSIS — K219 Gastro-esophageal reflux disease without esophagitis: Secondary | ICD-10-CM

## 2021-09-04 ENCOUNTER — Ambulatory Visit
Admission: EM | Admit: 2021-09-04 | Discharge: 2021-09-04 | Disposition: A | Discharge status: Home / Self Care | Payer: Medicare HMO | Attending: Urgent Care | Admitting: Urgent Care

## 2021-09-04 ENCOUNTER — Other Ambulatory Visit: Payer: Self-pay

## 2021-09-04 DIAGNOSIS — J018 Other acute sinusitis: Secondary | ICD-10-CM

## 2021-09-04 DIAGNOSIS — R059 Cough, unspecified: Secondary | ICD-10-CM

## 2021-09-04 DIAGNOSIS — R070 Pain in throat: Secondary | ICD-10-CM

## 2021-09-04 MED ORDER — AMOXICILLIN-POT CLAVULANATE 875-125 MG PO TABS: 1.0000 | ORAL_TABLET | Freq: Two times a day (BID) | ORAL | 0 refills | Status: DC

## 2021-09-04 MED ORDER — PREDNISONE 20 MG PO TABS: ORAL_TABLET | ORAL | 0 refills | Status: DC

## 2021-09-04 NOTE — ED Provider Notes (Signed)
[place]   MRN: [id number] DOB: 1951-04-11  Subjective:   Julia Olson is a 70 y.o. female presenting for 2-week history of persistent sinus headaches, body aches, throat pain, cough.  She is not having chest pain from her cough.  She has had bad allergies this year.  Has been using her allergy medication without relief.  Denies shortness of breath, fevers, hemoptysis, nausea, vomiting, abdominal pain.  No history of respiratory disorders.  Patient is not a smoker.  No current facility-administered medications for this encounter.  Current Outpatient Medications:    Cholecalciferol 50 MCG (2000 UT) TABS, Take 2 tablets (4,000 Units total) by mouth daily., Disp: 180 tablet, Rfl: 1   cyanocobalamin 2000 MCG tablet, Take 1 tablet (2,000 mcg total) by mouth daily., Disp: 90 tablet, Rfl: 3   ferrous sulfate 325 (65 FE) MG tablet, Take 1 tablet (325 mg total) by mouth daily with breakfast., Disp: 90 tablet, Rfl: 1   omeprazole (PRILOSEC) 20 MG capsule, TAKE TWO CAPSULES BY MOUTH DAILY, Disp: 180 capsule, Rfl: 0   No Known Allergies  Past Medical History:  Diagnosis Date   GERD 08/27/2010   Headache(784.0) 08/27/2010   NEPHROLITHIASIS, HX OF 08/27/2010   OSTEOARTHRITIS 08/27/2010     Past Surgical History:  Procedure Laterality Date   ABDOMINAL HYSTERECTOMY      Family History  Problem Relation Age of Onset   Arthritis Maternal Aunt    Alcohol abuse Other    Arthritis Other    Diabetes Other        1st degree relative   Hyperlipidemia Other    Hypertension Other    Cancer Other        FH of Colon Cancer   Heart disease Other     Social History   Tobacco Use   Smoking status: Never   Smokeless tobacco: Never  Substance Use Topics   Alcohol use: No   Drug use: No    ROS   Objective:   Vitals: BP 125/67 (BP Location: Left Arm)   Pulse 90   Temp 98.2 F (36.8 C) (Oral)   Resp 18   SpO2 96%   Physical Exam Constitutional:      General: She is  not in acute distress.    Appearance: Normal appearance. She is well-developed. She is not ill-appearing, toxic-appearing or diaphoretic.  HENT:     Head: Normocephalic and atraumatic.     Right Ear: Tympanic membrane, ear canal and external ear normal. No drainage or tenderness. No middle ear effusion. Tympanic membrane is not erythematous.     Left Ear: Tympanic membrane, ear canal and external ear normal. No drainage or tenderness.  No middle ear effusion. Tympanic membrane is not erythematous.     Nose: Nose normal. No congestion or rhinorrhea.     Mouth/Throat:     Mouth: Mucous membranes are moist. No oral lesions.     Pharynx: No pharyngeal swelling, oropharyngeal exudate, posterior oropharyngeal erythema or uvula swelling.     Tonsils: No tonsillar exudate or tonsillar abscesses.  Eyes:     General: No scleral icterus.       Right eye: No discharge.        Left eye: No discharge.     Extraocular Movements: Extraocular movements intact.     Right eye: Normal extraocular motion.     Left eye: Normal extraocular motion.     Conjunctiva/sclera: Conjunctivae normal.     Pupils: Pupils are equal, round, and reactive  to light.  Cardiovascular:     Rate and Rhythm: Normal rate and regular rhythm.     Pulses: Normal pulses.     Heart sounds: Normal heart sounds. No murmur heard.   No friction rub. No gallop.  Pulmonary:     Effort: Pulmonary effort is normal. No respiratory distress.     Breath sounds: Normal breath sounds. No stridor. No wheezing, rhonchi or rales.  Musculoskeletal:     Cervical back: Normal range of motion and neck supple.  Lymphadenopathy:     Cervical: No cervical adenopathy.  Skin:    General: Skin is warm and dry.     Findings: No rash.  Neurological:     General: No focal deficit present.     Mental Status: She is alert and oriented to person, place, and time.  Psychiatric:        Mood and Affect: Mood normal.        Behavior: Behavior normal.         Thought Content: Thought content normal.        Judgment: Judgment normal.    Assessment and Plan :   PDMP not reviewed this encounter.  1. Acute non-recurrent sinusitis of other sinus   2. Throat pain   3. Cough     I recommended a chest x-ray but patient refused for now.  She does have clear lung sounds and hemodynamically stable vital signs so I was agreeable.  Will manage with Augmentin and a steroid course in light of her persistent and difficult to treat allergies.  Recommended she maintain an oral antihistamine.  If no improvement within 48 hours, recommended recheck, chest x-ray at that point. Counseled patient on potential for adverse effects with medications prescribed/recommended today, ER and return-to-clinic precautions discussed, patient verbalized understanding.    Jaynee Eagles, [place] 09/04/21 [phone]

## 2021-09-04 NOTE — ED Triage Notes (Signed)
Pt c/o cough, sore throat, headache, body aches,   Onset ~2 weeks ago  Denies nausea, vomiting, diarrhea or constipation, fever.   States tried "cough and cold" and coricidin at home without relief.

## 2021-10-18 ENCOUNTER — Other Ambulatory Visit: Payer: Self-pay | Admitting: Internal Medicine

## 2021-10-18 DIAGNOSIS — K219 Gastro-esophageal reflux disease without esophagitis: Secondary | ICD-10-CM

## 2021-10-21 ENCOUNTER — Other Ambulatory Visit: Payer: Self-pay | Admitting: Internal Medicine

## 2021-10-21 DIAGNOSIS — K219 Gastro-esophageal reflux disease without esophagitis: Secondary | ICD-10-CM

## 2021-10-24 ENCOUNTER — Other Ambulatory Visit: Payer: Self-pay

## 2021-10-24 ENCOUNTER — Encounter: Payer: Self-pay | Admitting: Internal Medicine

## 2021-10-24 ENCOUNTER — Ambulatory Visit (INDEPENDENT_AMBULATORY_CARE_PROVIDER_SITE_OTHER): Payer: Medicare HMO | Admitting: Internal Medicine

## 2021-10-24 VITALS — BP 110/60 | HR 70 | Temp 98.5°F | Ht 65.0 in | Wt 180.0 lb

## 2021-10-24 DIAGNOSIS — I10 Essential (primary) hypertension: Secondary | ICD-10-CM | POA: Diagnosis not present

## 2021-10-24 DIAGNOSIS — E785 Hyperlipidemia, unspecified: Secondary | ICD-10-CM

## 2021-10-24 DIAGNOSIS — E559 Vitamin D deficiency, unspecified: Secondary | ICD-10-CM

## 2021-10-24 DIAGNOSIS — K219 Gastro-esophageal reflux disease without esophagitis: Secondary | ICD-10-CM

## 2021-10-24 MED ORDER — OMEPRAZOLE 20 MG PO CPDR: 40.0000 mg | DELAYED_RELEASE_CAPSULE | Freq: Every day | ORAL | 3 refills | Status: DC

## 2021-10-24 NOTE — Progress Notes (Signed)
Patient ID: Julia Olson, female   DOB: [birth year], 70 y.o.   MRN: [id number]        Chief Complaint: follow up breakthrough reflux, hld, htn, low vit d       HPI:  Julia Olson is a 70 y.o. female here overall doing ok, except for 1-2 wks gradually worsening reflux symptoms after ran out of PPI, despite other otc tums prn, and working on low fat anti-reflux diet.  Denies worsening abd pain, dysphagia, n/v, bowel change or blood.  Has been otherwise uncomplicated in past, and has not required EGD.  Pt denies chest pain, increased sob or doe, wheezing, orthopnea, PND, increased LE swelling, palpitations, dizziness or syncope.   Pt denies polydipsia, polyuria, or new focal neuro s/s.   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 She has never used smokeless tobacco. She reports that she does not drink alcohol and does not use drugs. family history includes Alcohol abuse in an other family member; Arthritis in her maternal aunt and another family member; Cancer in an other family member; Diabetes in an other family member; Heart disease in an other family member; Hyperlipidemia in an other family member; Hypertension in an other family member. No Known Allergies Current Outpatient Medications on File Prior to Visit  Medication Sig Dispense Refill   Cholecalciferol 50 MCG (2000 UT) TABS Take 2 tablets (4,000 Units total) by mouth daily. 180 tablet 1    cyanocobalamin 2000 MCG tablet Take 1 tablet (2,000 mcg total) by mouth daily. 90 tablet 3   Omega-3 Fatty Acids (OMEGA-3 FISH OIL PO) Take by mouth.     ferrous sulfate 325 (65 FE) MG tablet Take 1 tablet (325 mg total) by mouth daily with breakfast. (Patient not taking: Reported on 10/24/2021) 90 tablet 1   No current facility-administered medications on file prior to visit.        ROS:  All others reviewed and negative.  Objective        PE:  BP 110/60 (BP Location: Left Arm, Patient Position: Sitting, Cuff Size: Large)   Pulse 70   Temp 98.5 F (36.9 C) (Oral)   Ht 5\' 5"  (1.651 m)   Wt 180 lb (81.6 kg)   SpO2 97%   BMI 29.95 kg/m                 Constitutional: Pt appears in NAD               HENT: Head: NCAT.                Right Ear: External ear normal.                 Left Ear: External ear normal.                Eyes: . Pupils are  equal, round, and reactive to light. Conjunctivae and EOM are normal               Nose: without d/c or deformity               Neck: Neck supple. Gross normal ROM               Cardiovascular: Normal rate and regular rhythm.                 Pulmonary/Chest: Effort normal and breath sounds without rales or wheezing.                Abd:  Soft, NT, ND, + BS, no organomegaly               Neurological: Pt is alert. At baseline orientation, motor grossly intact               Skin: Skin is warm. No rashes, no other new lesions, LE edema - none               Psychiatric: Pt behavior is normal without agitation   Micro: none  Cardiac tracings I have personally interpreted today:  none  Pertinent Radiological findings (summarize): none   Lab Results  Component Value Date   WBC 5.6 10/09/2020   HGB 14.1 10/09/2020   HCT 43.1 10/09/2020   PLT 219.0 10/09/2020   GLUCOSE 82 10/09/2020   CHOL 265 (H) 10/09/2020   TRIG 143.0 10/09/2020   HDL 51.50 10/09/2020   LDLDIRECT 189.8 11/26/2011   LDLCALC 185 (H) 10/09/2020   ALT 16 10/09/2020   AST  19 10/09/2020   NA 141 10/09/2020   K 4.4 10/09/2020   CL 104 10/09/2020   CREATININE 0.94 10/09/2020   BUN 12 10/09/2020   CO2 31 10/09/2020   TSH 3.31 10/09/2020   Assessment/Plan:  Julia Olson is a 70 y.o. White or Caucasian [1] female with  has a past medical history of GERD (08/27/2010), Headache(784.0) (08/27/2010), NEPHROLITHIASIS, HX OF (08/27/2010), and OSTEOARTHRITIS (08/27/2010).  GERD Uncontrolled with new worsening symptoms after running out of PPI; will restart and  to f/u any worsening symptoms or concerns  Hyperlipidemia with target LDL less than 130 Lab Results  Component Value Date   LDLCALC 185 (H) 10/09/2020   Uncontrolled, d/w pt for low chol diet and pt continues to declines statin trial or zetia or lipid clinic for now; she is interested in CT cardiac score to be ordered and may reconsider if CT is abnormal   Hypertension BP Readings from Last 3 Encounters:  10/24/21 110/60  09/04/21 125/67  10/09/20 116/78   Stable, pt to continue medical treatment with diet and wt control, does not appear to need antiHTN med for now   Vitamin D deficiency disease Last vitamin D Lab Results  Component Value Date   VD25OH 61.23 10/09/2020   Stable, cont oral replacement  Followup: Return in about 3 months (around 01/24/2022) for to dr Ronnald Ramp.  Cathlean Cower, MD 10/25/2021 1:36 PM [facility]

## 2021-10-24 NOTE — Patient Instructions (Signed)
Please continue all other medications as before, and refills have been done if requested for the omeprazole  We have discussed the Cardiac CT Score test to measure the calcification level (if any) in your heart arteries.  This test has been ordered in our [facility], so please call  CT directly, as they prefer this, at [phone] to be scheduled.  Please have the pharmacy call with any other refills you may need.  Please continue your efforts at being more active, low cholesterol diet, and weight control.  Please keep your appointments with your specialists as you may have planned  Please make an Appointment to return in 3 months, or sooner if needed - to Dr Ronnald Ramp

## 2021-10-25 ENCOUNTER — Encounter: Payer: Self-pay | Admitting: Internal Medicine

## 2021-10-25 NOTE — Assessment & Plan Note (Signed)
Lab Results  Component Value Date   LDLCALC 185 (H) 10/09/2020   Uncontrolled, d/w pt for low chol diet and pt continues to declines statin trial or zetia or lipid clinic for now; she is interested in CT cardiac score to be ordered and may reconsider if CT is abnormal

## 2021-10-25 NOTE — Assessment & Plan Note (Signed)
Last vitamin D Lab Results  Component Value Date   VD25OH 61.23 10/09/2020   Stable, cont oral replacement

## 2021-10-25 NOTE — Assessment & Plan Note (Signed)
BP Readings from Last 3 Encounters:  10/24/21 110/60  09/04/21 125/67  10/09/20 116/78   Stable, pt to continue medical treatment with diet and wt control, does not appear to need antiHTN med for now

## 2021-10-25 NOTE — Assessment & Plan Note (Signed)
Uncontrolled with new worsening symptoms after running out of PPI; will restart and  to f/u any worsening symptoms or concerns

## 2021-10-28 ENCOUNTER — Ambulatory Visit: Payer: Medicare HMO | Admitting: Internal Medicine

## 2022-01-19 DIAGNOSIS — L820 Inflamed seborrheic keratosis: Secondary | ICD-10-CM | POA: Diagnosis not present

## 2022-01-19 DIAGNOSIS — L814 Other melanin hyperpigmentation: Secondary | ICD-10-CM | POA: Diagnosis not present

## 2022-01-19 DIAGNOSIS — D2239 Melanocytic nevi of other parts of face: Secondary | ICD-10-CM | POA: Diagnosis not present

## 2022-01-19 DIAGNOSIS — D2261 Melanocytic nevi of right upper limb, including shoulder: Secondary | ICD-10-CM | POA: Diagnosis not present

## 2022-01-19 DIAGNOSIS — D2262 Melanocytic nevi of left upper limb, including shoulder: Secondary | ICD-10-CM | POA: Diagnosis not present

## 2022-01-19 DIAGNOSIS — L821 Other seborrheic keratosis: Secondary | ICD-10-CM | POA: Diagnosis not present

## 2022-01-21 LAB — HM MAMMOGRAPHY

## 2022-01-22 ENCOUNTER — Other Ambulatory Visit: Payer: Self-pay | Admitting: Internal Medicine

## 2022-01-22 DIAGNOSIS — Z1231 Encounter for screening mammogram for malignant neoplasm of breast: Secondary | ICD-10-CM

## 2022-01-26 ENCOUNTER — Ambulatory Visit
Admission: RE | Admit: 2022-01-26 | Discharge: 2022-01-26 | Disposition: A | Discharge status: Home / Self Care | Payer: Medicare HMO | Source: Ambulatory Visit | Attending: Internal Medicine | Admitting: Internal Medicine

## 2022-01-26 ENCOUNTER — Ambulatory Visit: Payer: Medicare HMO | Admitting: Internal Medicine

## 2022-01-26 DIAGNOSIS — Z1231 Encounter for screening mammogram for malignant neoplasm of breast: Secondary | ICD-10-CM

## 2022-02-04 ENCOUNTER — Ambulatory Visit (INDEPENDENT_AMBULATORY_CARE_PROVIDER_SITE_OTHER): Payer: Medicare HMO | Admitting: Internal Medicine

## 2022-02-04 ENCOUNTER — Other Ambulatory Visit: Payer: Self-pay

## 2022-02-04 ENCOUNTER — Encounter: Payer: Self-pay | Admitting: Internal Medicine

## 2022-02-04 VITALS — BP 128/78 | HR 74 | Temp 98.2°F | Resp 16 | Ht 65.0 in | Wt 175.0 lb

## 2022-02-04 DIAGNOSIS — Z0000 Encounter for general adult medical examination without abnormal findings: Secondary | ICD-10-CM | POA: Diagnosis not present

## 2022-02-04 DIAGNOSIS — D508 Other iron deficiency anemias: Secondary | ICD-10-CM

## 2022-02-04 DIAGNOSIS — I10 Essential (primary) hypertension: Secondary | ICD-10-CM | POA: Diagnosis not present

## 2022-02-04 DIAGNOSIS — K219 Gastro-esophageal reflux disease without esophagitis: Secondary | ICD-10-CM | POA: Diagnosis not present

## 2022-02-04 DIAGNOSIS — E785 Hyperlipidemia, unspecified: Secondary | ICD-10-CM

## 2022-02-04 DIAGNOSIS — Z23 Encounter for immunization: Secondary | ICD-10-CM | POA: Insufficient documentation

## 2022-02-04 LAB — CBC WITH DIFFERENTIAL/PLATELET
Basophils Absolute: 0 10*3/uL (ref 0.0–0.1)
Basophils Relative: 0.5 % (ref 0.0–3.0)
Eosinophils Absolute: 0.2 10*3/uL (ref 0.0–0.7)
Eosinophils Relative: 3.1 % (ref 0.0–5.0)
HCT: 42 % (ref 36.0–46.0)
Hemoglobin: 13.7 g/dL (ref 12.0–15.0)
Lymphocytes Relative: 34.6 % (ref 12.0–46.0)
Lymphs Abs: 2 10*3/uL (ref 0.7–4.0)
MCHC: 32.6 g/dL (ref 30.0–36.0)
MCV: 85.7 fl (ref 78.0–100.0)
Monocytes Absolute: 0.5 10*3/uL (ref 0.1–1.0)
Monocytes Relative: 9 % (ref 3.0–12.0)
Neutro Abs: 3 10*3/uL (ref 1.4–7.7)
Neutrophils Relative %: 52.8 % (ref 43.0–77.0)
Platelets: 194 10*3/uL (ref 150.0–400.0)
RBC: 4.9 Mil/uL (ref 3.87–5.11)
RDW: 13.8 % (ref 11.5–15.5)
WBC: 5.7 10*3/uL (ref 4.0–10.5)

## 2022-02-04 LAB — URINALYSIS, ROUTINE W REFLEX MICROSCOPIC
Bilirubin Urine: NEGATIVE
Hgb urine dipstick: NEGATIVE
Ketones, ur: NEGATIVE
Leukocytes,Ua: NEGATIVE
Nitrite: NEGATIVE
RBC / HPF: NONE SEEN (ref 0–?)
Specific Gravity, Urine: 1.01 (ref 1.000–1.030)
Total Protein, Urine: NEGATIVE
Urine Glucose: NEGATIVE
Urobilinogen, UA: 0.2 (ref 0.0–1.0)
pH: 6 (ref 5.0–8.0)

## 2022-02-04 LAB — LIPID PANEL
Cholesterol: 261 mg/dL — ABNORMAL HIGH (ref 0–200)
HDL: 45.4 mg/dL (ref >39.00)
LDL Cholesterol: 185 mg/dL — ABNORMAL HIGH (ref 0–99)
NonHDL: 215.19
Total CHOL/HDL Ratio: 6
Triglycerides: 153 mg/dL — ABNORMAL HIGH (ref 0.0–149.0)
VLDL: 30.6 mg/dL (ref 0.0–40.0)

## 2022-02-04 LAB — IBC + FERRITIN
Ferritin: 11.3 ng/mL (ref 10.0–291.0)
Iron: 81 ug/dL (ref 42–145)
Saturation Ratios: 17.8 % — ABNORMAL LOW (ref 20.0–50.0)
TIBC: 455 ug/dL — ABNORMAL HIGH (ref 250.0–450.0)
Transferrin: 325 mg/dL (ref 212.0–360.0)

## 2022-02-04 LAB — HEPATIC FUNCTION PANEL
ALT: 14 U/L (ref 0–35)
AST: 17 U/L (ref 0–37)
Albumin: 4.3 g/dL (ref 3.5–5.2)
Alkaline Phosphatase: 81 U/L (ref 39–117)
Bilirubin, Direct: 0 mg/dL (ref 0.0–0.3)
Total Bilirubin: 0.6 mg/dL (ref 0.2–1.2)
Total Protein: 7.1 g/dL (ref 6.0–8.3)

## 2022-02-04 LAB — BASIC METABOLIC PANEL
BUN: 14 mg/dL (ref 6–23)
CO2: 32 mEq/L (ref 19–32)
Calcium: 9.9 mg/dL (ref 8.4–10.5)
Chloride: 104 mEq/L (ref 96–112)
Creatinine, Ser: 0.91 mg/dL (ref 0.40–1.20)
GFR: 63.49 mL/min (ref >60.00)
Glucose, Bld: 85 mg/dL (ref 70–99)
Potassium: 3.9 mEq/L (ref 3.5–5.1)
Sodium: 141 mEq/L (ref 135–145)

## 2022-02-04 LAB — TSH: TSH: 2.88 u[IU]/mL (ref 0.35–5.50)

## 2022-02-04 MED ORDER — SHINGRIX 50 MCG/0.5ML IM SUSR: 0.5000 mL | Freq: Once | INTRAMUSCULAR | 1 refills | Status: AC

## 2022-02-04 NOTE — Patient Instructions (Signed)

## 2022-02-04 NOTE — Progress Notes (Signed)
Subjective:  Patient ID: Julia Olson, female    DOB: 1951-07-04  Age: 71 y.o. MRN: [id number]  CC: Annual Exam and Hypertension  This visit occurred during the SARS-CoV-2 public health emergency.  Safety protocols were in place, including screening questions prior to the visit, additional usage of staff PPE, and extensive cleaning of exam room while observing appropriate contact time as indicated for disinfecting solutions.    HPI Julia Olson presents for a CPX and f/up -   She is active and denies CP, DOE, SOB, edema, fatigue.  Outpatient Medications Prior to Visit  Medication Sig Dispense Refill   Cholecalciferol 50 MCG (2000 UT) TABS Take 2 tablets (4,000 Units total) by mouth daily. 180 tablet 1   cyanocobalamin 2000 MCG tablet Take 1 tablet (2,000 mcg total) by mouth daily. 90 tablet 3   ferrous sulfate 325 (65 FE) MG tablet Take 1 tablet (325 mg total) by mouth daily with breakfast. 90 tablet 1   Omega-3 Fatty Acids (OMEGA-3 FISH OIL PO) Take by mouth.     omeprazole (PRILOSEC) 20 MG capsule Take 2 capsules (40 mg total) by mouth daily. 180 capsule 3   No facility-administered medications prior to visit.    ROS Review of Systems  Constitutional:  Negative for diaphoresis and fatigue.  HENT: Negative.    Eyes: Negative.   Respiratory:  Negative for cough, chest tightness, shortness of breath and wheezing.   Cardiovascular:  Negative for chest pain, palpitations and leg swelling.  Gastrointestinal:  Negative for abdominal pain, constipation, diarrhea, nausea and vomiting.  Endocrine: Negative.   Genitourinary: Negative.  Negative for difficulty urinating.  Musculoskeletal:  Negative for arthralgias and myalgias.  Skin: Negative.   Neurological:  Negative for dizziness, weakness and light-headedness.  Hematological:  Negative for adenopathy. Does not bruise/bleed easily.  Psychiatric/Behavioral: Negative.     Objective:  BP 128/78 (BP Location: Left Arm,  Patient Position: Sitting, Cuff Size: Large)    Pulse 74    Temp 98.2 F (36.8 C) (Oral)    Resp 16    Ht 5\' 5"  (1.651 m)    Wt 175 lb (79.4 kg)    SpO2 95%    BMI 29.12 kg/m   BP Readings from Last 3 Encounters:  02/04/22 128/78  10/24/21 110/60  09/04/21 125/67    Wt Readings from Last 3 Encounters:  02/04/22 175 lb (79.4 kg)  10/24/21 180 lb (81.6 kg)  10/09/20 175 lb (79.4 kg)    Physical Exam Vitals reviewed.  Constitutional:      Appearance: Normal appearance.  HENT:     Nose: Nose normal.     Mouth/Throat:     Mouth: Mucous membranes are moist.  Eyes:     General: No scleral icterus.    Conjunctiva/sclera: Conjunctivae normal.  Cardiovascular:     Rate and Rhythm: Normal rate and regular rhythm.     Heart sounds: No murmur heard. Pulmonary:     Effort: Pulmonary effort is normal.     Breath sounds: No stridor. No wheezing, rhonchi or rales.  Abdominal:     General: Abdomen is flat.     Palpations: There is no mass.     Tenderness: There is no abdominal tenderness. There is no guarding.     Hernia: No hernia is present.  Musculoskeletal:     Cervical back: Neck supple.     Right lower leg: No edema.     Left lower leg: No edema.  Lymphadenopathy:  Cervical: No cervical adenopathy.  Skin:    General: Skin is warm and dry.  Neurological:     General: No focal deficit present.     Mental Status: She is alert. Mental status is at baseline.  Psychiatric:        Mood and Affect: Mood normal.        Behavior: Behavior normal.    Lab Results  Component Value Date   WBC 5.7 02/04/2022   HGB 13.7 02/04/2022   HCT 42.0 02/04/2022   PLT 194.0 02/04/2022   GLUCOSE 85 02/04/2022   CHOL 261 (H) 02/04/2022   TRIG 153.0 (H) 02/04/2022   HDL 45.40 02/04/2022   LDLDIRECT 189.8 11/26/2011   LDLCALC 185 (H) 02/04/2022   ALT 14 02/04/2022   AST 17 02/04/2022   NA 141 02/04/2022   K 3.9 02/04/2022   CL 104 02/04/2022   CREATININE 0.91 02/04/2022   BUN 14  02/04/2022   CO2 32 02/04/2022   TSH 2.88 02/04/2022    MM 3D SCREEN BREAST BILATERAL  Result Date: 01/26/2022 CLINICAL DATA:  Screening. EXAM: DIGITAL SCREENING BILATERAL MAMMOGRAM WITH TOMOSYNTHESIS AND CAD TECHNIQUE: Bilateral screening digital craniocaudal and mediolateral oblique mammograms were obtained. Bilateral screening digital breast tomosynthesis was performed. The images were evaluated with computer-aided detection. COMPARISON:  Previous exam(s). ACR Breast Density Category b: There are scattered areas of fibroglandular density. FINDINGS: There are no findings suspicious for malignancy. IMPRESSION: No mammographic evidence of malignancy. A result letter of this screening mammogram will be mailed directly to the patient. RECOMMENDATION: Screening mammogram in one year. (Code:SM-B-01Y) BI-RADS CATEGORY  1: Negative. Electronically Signed   By: Abelardo Diesel M.D.   On: 01/26/2022 09:37    Assessment & Plan:   Julia Olson was seen today for annual exam and hypertension.  Diagnoses and all orders for this visit:  Primary hypertension- Her BP is well controlled. -     Basic metabolic panel; Future -     CBC with Differential/Platelet; Future -     TSH; Future -     Urinalysis, Routine w reflex microscopic; Future -     Urinalysis, Routine w reflex microscopic -     TSH -     CBC with Differential/Platelet -     Basic metabolic panel  Gastroesophageal reflux disease without esophagitis- Sx's are well controlled. -     CBC with Differential/Platelet; Future -     CBC with Differential/Platelet  Hyperlipidemia with target LDL less than 130- Her ASCVD risk score is 11.6%. Statin tx is not indicated. -     Lipid panel; Future -     TSH; Future -     Hepatic function panel; Future -     Hepatic function panel -     TSH -     Lipid panel  Routine general medical examination at a health care facility- Exam completed, labs reviewed, vaccines reviewed and updated, cancer screenings are  up-to-date, patient education was given.  Other iron deficiency anemia- Her H/H and iron are normal now. -     CBC with Differential/Platelet; Future -     IBC + Ferritin; Future -     IBC + Ferritin -     CBC with Differential/Platelet  Need for shingles vaccine -     Zoster Vaccine Adjuvanted [facility]) injection; Inject 0.5 mLs into the muscle once for 1 dose.   I am having Kathleen Tamm. Penn start on Shingrix. I am also having her maintain her  cyanocobalamin, ferrous sulfate, Cholecalciferol, Omega-3 Fatty Acids (OMEGA-3 FISH OIL PO), and omeprazole.  Meds ordered this encounter  Medications   Zoster Vaccine Adjuvanted [facility]) injection    Sig: Inject 0.5 mLs into the muscle once for 1 dose.    Dispense:  0.5 mL    Refill:  1     Follow-up: Return in about 6 months (around 08/04/2022).  [name], MD

## 2022-03-10 ENCOUNTER — Telehealth: Payer: Self-pay | Admitting: Internal Medicine

## 2022-03-10 NOTE — Telephone Encounter (Signed)
N/A VM full and unable to leave a message for patient to call back to schedule Medicare Annual Wellness Visit  ? ?Last AWV  01/11/19 ? ?Please schedule at anytime with [facility] if patient calls the office back.   ? ? ?Any questions, please call me at [phone]  ?

## 2022-10-06 ENCOUNTER — Other Ambulatory Visit: Payer: Self-pay | Admitting: Internal Medicine

## 2022-10-06 DIAGNOSIS — K219 Gastro-esophageal reflux disease without esophagitis: Secondary | ICD-10-CM

## 2022-10-06 NOTE — Patient Instructions (Signed)

## 2022-10-06 NOTE — Telephone Encounter (Signed)
Ok to PCP please 

## 2022-10-06 NOTE — Progress Notes (Unsigned)
Subjective:   Julia Olson is a 71 y.o. female who presents for Medicare Annual (Subsequent) preventive examination. I connected with  Julia Olson on 10/06/22 by a audio enabled telemedicine application and verified that I am speaking with the correct person using two identifiers.  Patient Location: Home  Provider Location: Home Office  I discussed the limitations of evaluation and management by telemedicine. The patient expressed understanding and agreed to proceed.  Review of Systems    Deferred to PCP       Objective:    There were no vitals filed for this visit. There is no height or weight on file to calculate BMI.     11/17/2016   12:48 PM 10/30/2015    8:49 AM  Advanced Directives  Does Patient Have a Medical Advance Directive? Yes No;Yes  Type of Estate agent of [place];Living will Healthcare Power of [place];Living will  Does patient want to make changes to medical advance directive?  No - Patient declined  Copy of Healthcare Power of Attorney in Chart? Yes Yes    Current Medications (verified) Outpatient Encounter Medications as of 10/07/2022  Medication Sig   Cholecalciferol 50 MCG (2000 UT) TABS Take 2 tablets (4,000 Units total) by mouth daily.   cyanocobalamin 2000 MCG tablet Take 1 tablet (2,000 mcg total) by mouth daily.   ferrous sulfate 325 (65 FE) MG tablet Take 1 tablet (325 mg total) by mouth daily with breakfast.   Omega-3 Fatty Acids (OMEGA-3 FISH OIL PO) Take by mouth.   omeprazole (PRILOSEC) 20 MG capsule TAKE TWO CAPSULES BY MOUTH DAILY   No facility-administered encounter medications on file as of 10/07/2022.    Allergies (verified) Patient has no known allergies.   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   OSTEOARTHRITIS 08/27/2010   Past Surgical History:  Procedure Laterality Date   ABDOMINAL HYSTERECTOMY      Family History  Problem Relation Age of Onset   Arthritis Maternal Aunt    Alcohol abuse Other    Arthritis Other    Diabetes Other        1st degree relative   Hyperlipidemia Other    Hypertension Other    Cancer Other        FH of Colon Cancer   Heart disease Other    Social History   Socioeconomic History   Marital status: Married    Spouse name: Not on file   Number of children: Not on file   Years of education: Not on file   Highest education level: Not on file  Occupational History   Occupation: [employer]ing [employer]  Tobacco Use   Smoking status: Never   Smokeless tobacco: Never  Substance and Sexual Activity   Alcohol use: No   Drug use: No   Sexual activity: Not Currently  Other Topics Concern   Not on file  Social History Narrative   Regular exercise-yes   Social Determinants of Health   Financial Resource Strain: Not on file  Food Insecurity: Not on file  Transportation Needs: Not on file  Physical Activity: Not on file  Stress: Not on file  Social Connections: Not on file    Tobacco Counseling Counseling given: Not Answered   Clinical Intake:                 Diabetic?No         Activities of  Daily Living    10/24/2021    1:59 PM  In your present state of health, do you have any difficulty performing the following activities:  Hearing? 0  Vision? 0  Difficulty concentrating or making decisions? 0  Walking or climbing stairs? 0  Dressing or bathing? 0  Doing errands, shopping? 0    Patient Care Team: Etta Grandchild, MD as PCP - General  Indicate any recent Medical Services you may have received from other than [facility] providers in the past year (date may be approximate).     Assessment:   This is a routine wellness examination for [name].  Hearing/Vision screen No results found.  Dietary issues and exercise activities discussed:     Goals Addressed   None   Depression Screen    10/24/2021    1:58 PM  10/09/2020    8:19 AM 01/12/2019    4:41 PM 01/11/2019   10:11 AM 11/17/2017    7:59 AM 11/17/2016   12:49 PM 10/30/2015    8:49 AM  PHQ 2/9 Scores  PHQ - 2 Score 0 0 0 0 0 0 0    Fall Risk    10/24/2021    1:58 PM 10/09/2020    8:19 AM 01/12/2019    4:42 PM 01/12/2019    4:41 PM 01/11/2019   10:11 AM  Fall Risk   Falls in the past year? 0 0 0 0 0  Number falls in past yr: 0    0  Injury with Fall? 0    0  Follow up     Falls evaluation completed    FALL RISK PREVENTION PERTAINING TO THE HOME:  Any stairs in or around the home? {YES/NO:21197} If so, are there any without handrails? {YES/NO:21197} Home free of loose throw rugs in walkways, pet beds, electrical cords, etc? {YES/NO:21197} Adequate lighting in your home to reduce risk of falls? {YES/NO:21197}  ASSISTIVE DEVICES UTILIZED TO PREVENT FALLS:  Life alert? {YES/NO:21197} Use of a cane, walker or w/c? {YES/NO:21197} Grab bars in the bathroom? {YES/NO:21197} Shower chair or bench in shower? {YES/NO:21197} Elevated toilet seat or a handicapped toilet? {YES/NO:21197}  Cognitive Function:        Immunizations Immunization History  Administered Date(s) Administered   Fluad Quad(high Dose 65+) 10/09/2020   Influenza Split 11/26/2011, 09/26/2012, 09/20/2014   Influenza, High Dose Seasonal PF 10/18/2021   Influenza,inj,Quad PF,6+ Mos 09/21/2016, 09/11/2017, 09/20/2018   Influenza-Unspecified 09/06/2013, 09/14/2013, 10/09/2015   Moderna Sars-Covid-2 Vaccination 02/08/2020, 03/07/2020   Pneumococcal Conjugate-13 11/10/2016   Pneumococcal Polysaccharide-23 11/16/2017   Td 08/27/2010   Tdap 10/09/2020    TDAP status: Up to date  Flu Vaccine status: Due, Education has been provided regarding the importance of this vaccine. Advised may receive this vaccine at local pharmacy or Health Dept. Aware to provide a copy of the vaccination record if obtained from local pharmacy or Health Dept. Verbalized acceptance and  understanding.  Pneumococcal vaccine status: Up to date  Covid-19 vaccine status: Information provided on how to obtain vaccines.   Qualifies for Shingles Vaccine? Yes   Zostavax completed No   Shingrix Completed?: No.    Education has been provided regarding the importance of this vaccine. Patient has been advised to call insurance company to determine out of pocket expense if they have not yet received this vaccine. Advised may also receive vaccine at local pharmacy or Health Dept. Verbalized acceptance and understanding.  Screening Tests Health Maintenance  Topic Date Due   Zoster Vaccines-  Shingrix (1 of 2) Never done   COVID-19 Vaccine (3 - Moderna risk series) 04/04/2020   INFLUENZA VACCINE  07/14/2022   Fecal DNA (Cologuard)  10/29/2023   Medicare Annual Wellness (AWV)  11/07/2023   MAMMOGRAM  01/27/2024   TETANUS/TDAP  10/09/2030   Pneumonia Vaccine 35+ Years old  Completed   DEXA SCAN  Completed   Hepatitis C Screening  Completed   HPV VACCINES  Aged Out    Health Maintenance  Health Maintenance Due  Topic Date Due   Zoster Vaccines- Shingrix (1 of 2) Never done   COVID-19 Vaccine (3 - Moderna risk series) 04/04/2020   INFLUENZA VACCINE  07/14/2022    Colorectal cancer screening: Type of screening: Cologuard. Completed 10/28/20. Repeat every 3 years  Mammogram status: Completed 01/26/22. Repeat every year  {Bone Density status:21018021}  Lung Cancer Screening: (Low Dose CT Chest recommended if Age 96-80 years, 30 pack-year currently smoking OR have quit w/in 15years.) does not qualify.   Additional Screening:  Hepatitis C Screening: does qualify; Completed 10/29/15  Vision Screening: Recommended annual ophthalmology exams for early detection of glaucoma and other disorders of the eye. Is the patient up to date with their annual eye exam?  {YES/NO:21197} Who is the provider or what is the name of the office in which the patient attends annual eye exams? *** If  pt is not established with a provider, would they like to be referred to a provider to establish care? {YES/NO:21197}.   Dental Screening: Recommended annual dental exams for proper oral hygiene  Community Resource Referral / Chronic Care Management: CRR required this visit?  {YES/NO:21197}  CCM required this visit?  {YES/NO:21197}     Plan:     I have personally reviewed and noted the following in the patient's chart:   Medical and social history Use of alcohol, tobacco or illicit drugs  Current medications and supplements including opioid prescriptions. Patient is not currently taking opioid prescriptions. Functional ability and status Nutritional status Physical activity Advanced directives List of other physicians Hospitalizations, surgeries, and ER visits in previous 12 months Vitals Screenings to include cognitive, depression, and falls Referrals and appointments  In addition, I have reviewed and discussed with patient certain preventive protocols, quality metrics, and best practice recommendations. A written personalized care plan for preventive services as well as general preventive health recommendations were provided to patient.     Wanda Plump, RN   10/06/2022   Nurse Notes:  Ms. Eccher , Thank you for taking time to come for your Medicare Wellness Visit. I appreciate your ongoing commitment to your health goals. Please review the following plan we discussed and let me know if I can assist you in the future.   These are the goals we discussed:  Goals   None     This is a list of the screening recommended for you and due dates:  Health Maintenance  Topic Date Due   Zoster (Shingles) Vaccine (1 of 2) Never done   COVID-19 Vaccine (3 - Moderna risk series) 04/04/2020   Flu Shot  07/14/2022   Cologuard (Stool DNA test)  10/29/2023   Medicare Annual Wellness Visit  11/07/2023   Mammogram  01/27/2024   Tetanus Vaccine  10/09/2030   Pneumonia Vaccine   Completed   DEXA scan (bone density measurement)  Completed   Hepatitis C Screening: USPSTF Recommendation to screen - Ages 26-79 yo.  Completed   HPV Vaccine  Aged Out

## 2022-10-07 ENCOUNTER — Ambulatory Visit: Payer: Medicare HMO | Admitting: *Deleted

## 2022-10-07 DIAGNOSIS — Z0000 Encounter for general adult medical examination without abnormal findings: Secondary | ICD-10-CM

## 2022-10-26 IMAGING — MG MM DIGITAL SCREENING BILAT W/ TOMO AND CAD
8 series · 8 of 24 positions shown · non-contrast
Comparison: Previous exam(s).

CLINICAL DATA: Screening.

EXAM:
DIGITAL SCREENING BILATERAL MAMMOGRAM WITH TOMOSYNTHESIS AND CAD
TECHNIQUE: Bilateral screening digital craniocaudal and mediolateral oblique
mammograms were obtained. Bilateral screening digital breast
tomosynthesis was performed. The images were evaluated with
computer-aided detection.

[R CC synth-2D]
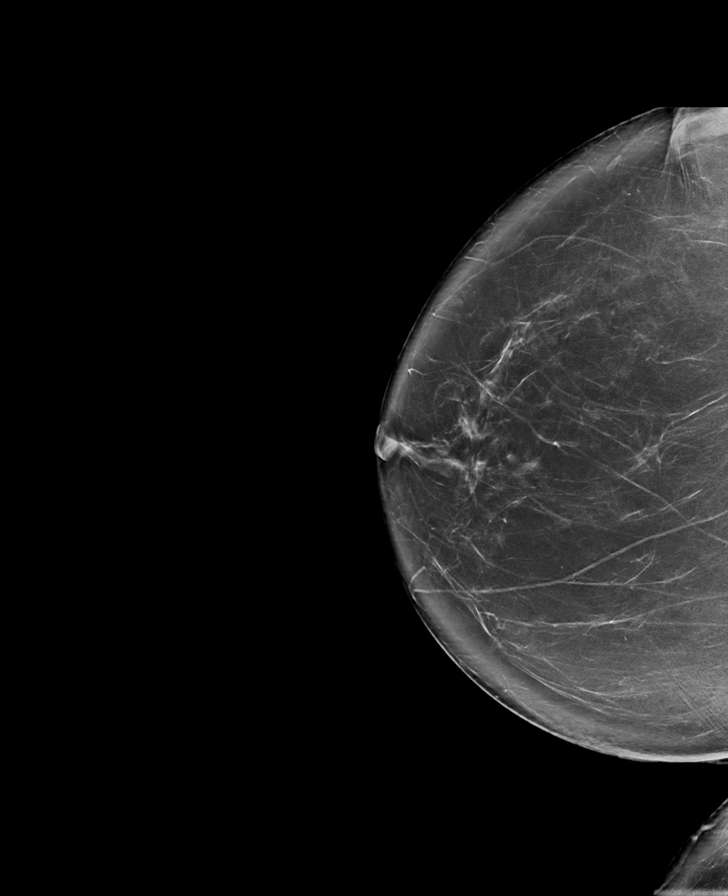

[L CC synth-2D]
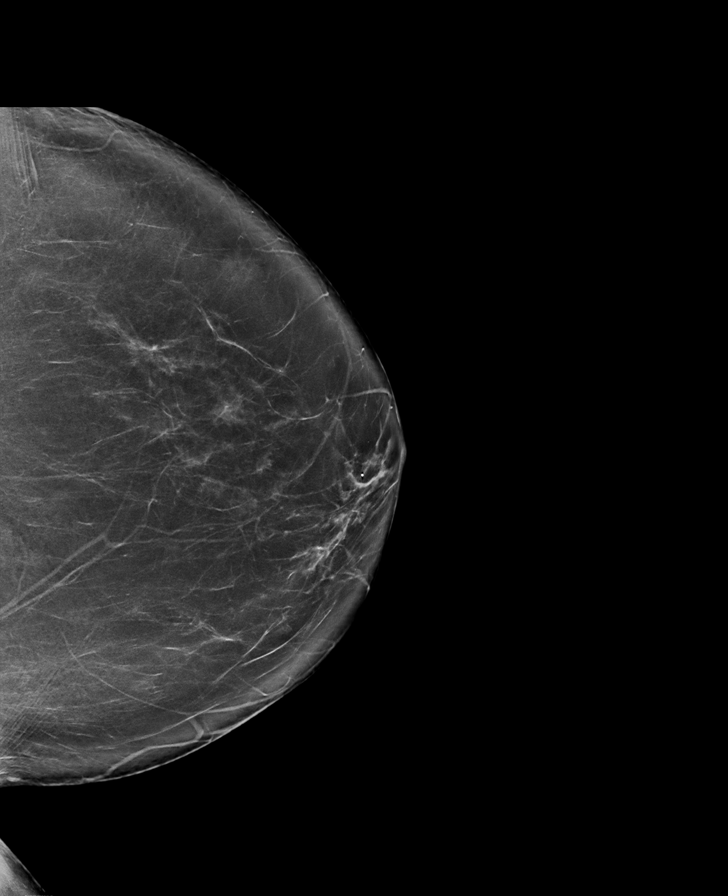

[R MLO synth-2D]
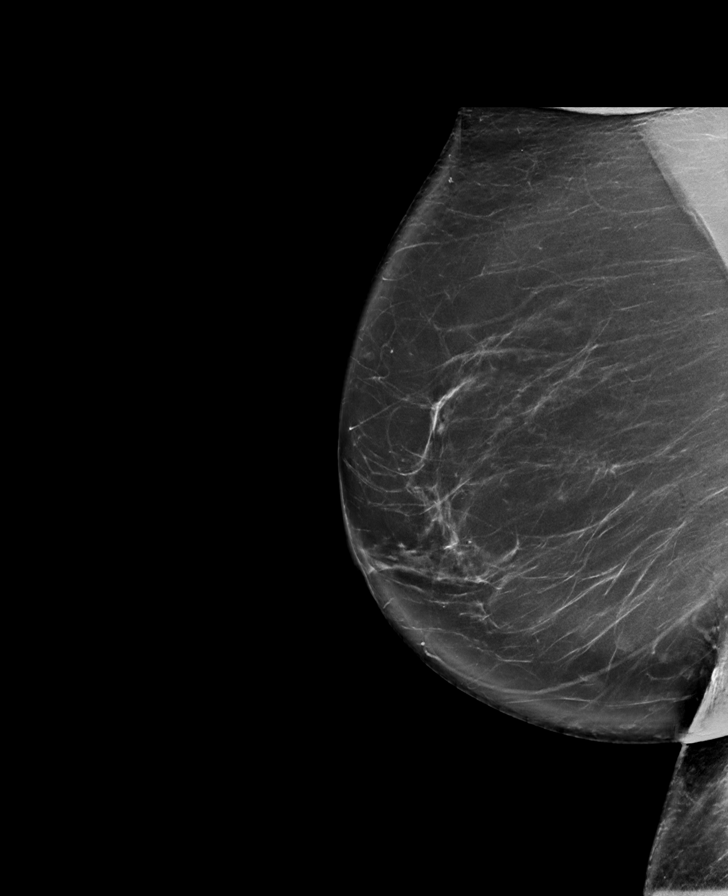

[L MLO synth-2D]
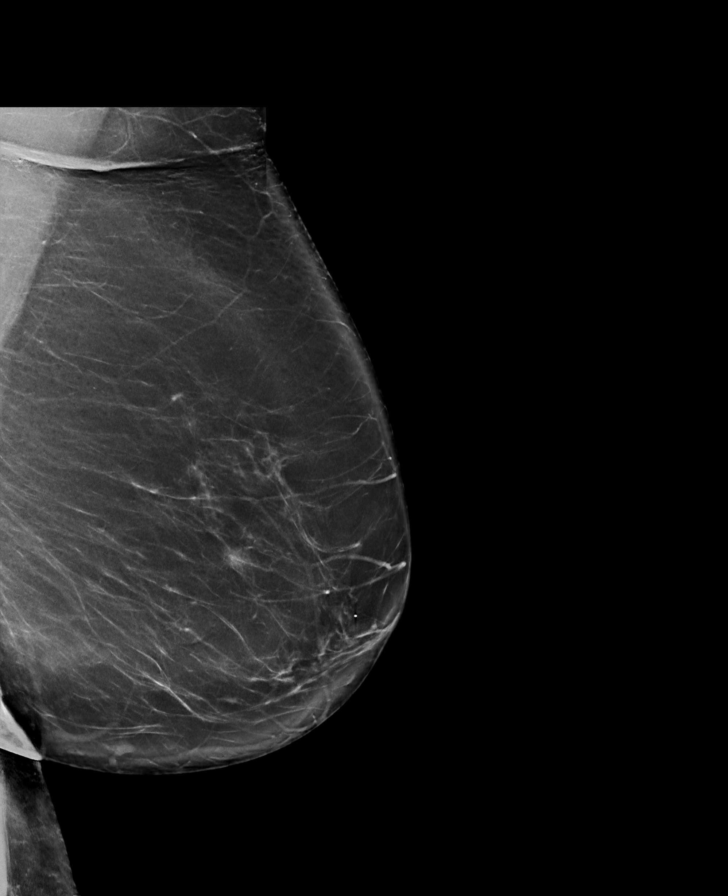

[R CC tomo · tomo slice 47/94.0]
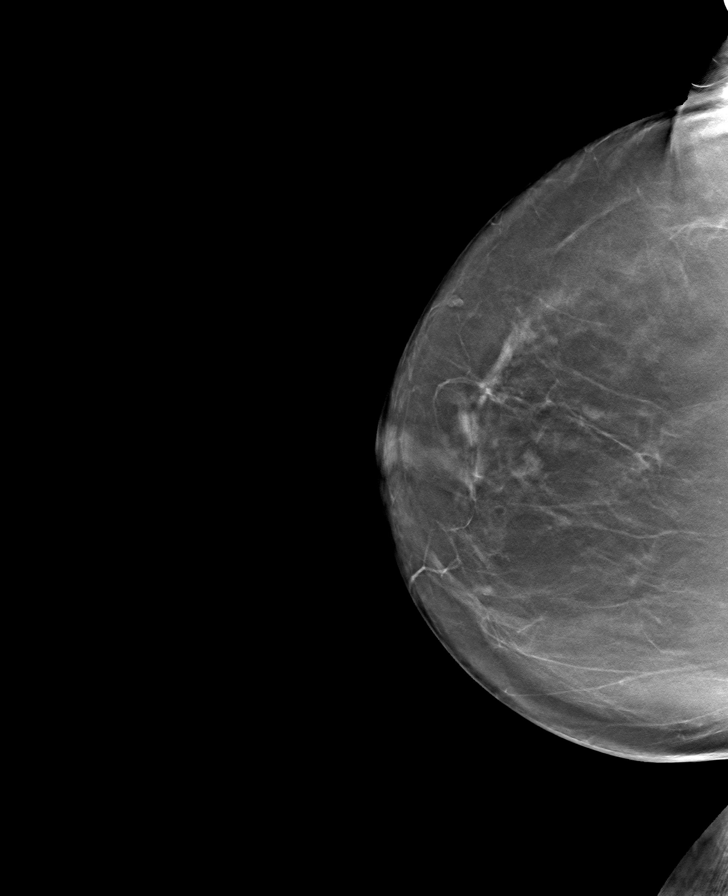

[L CC tomo · tomo slice 42/83.0]
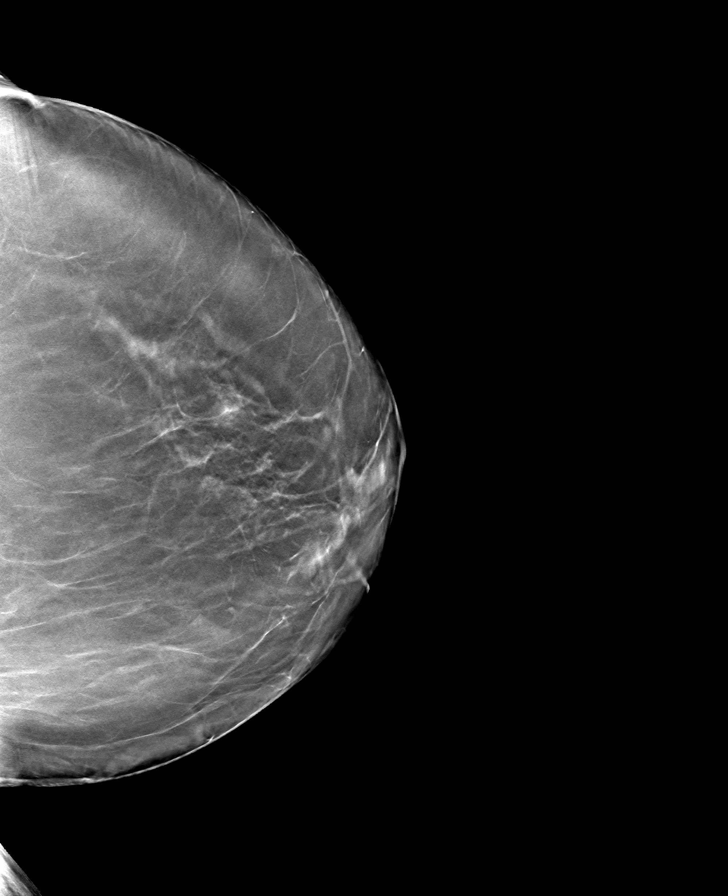

[R MLO tomo · tomo slice 49/97.0]
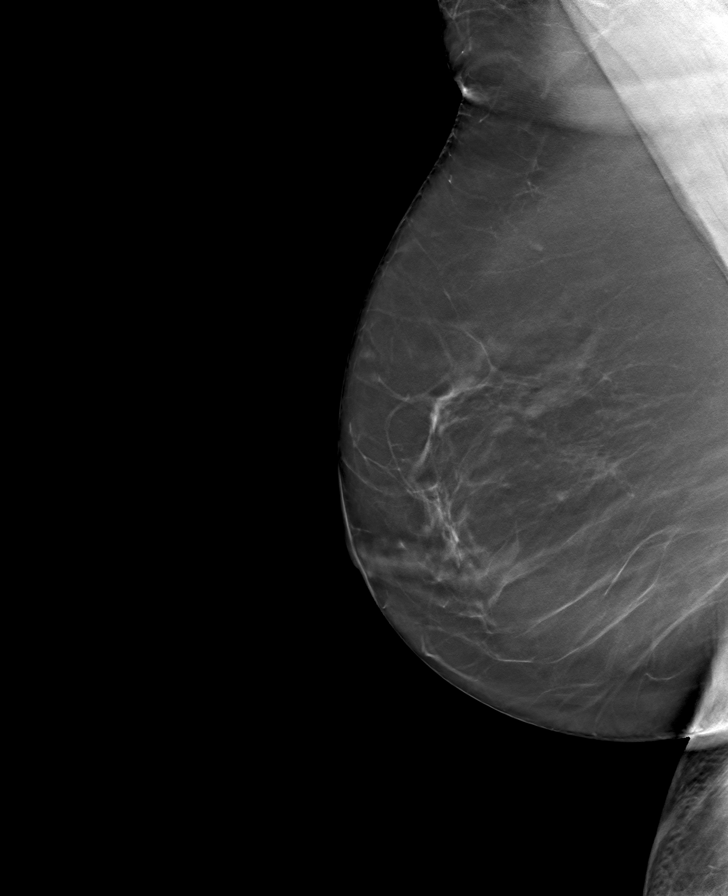

[L MLO tomo · tomo slice 47/92.0]
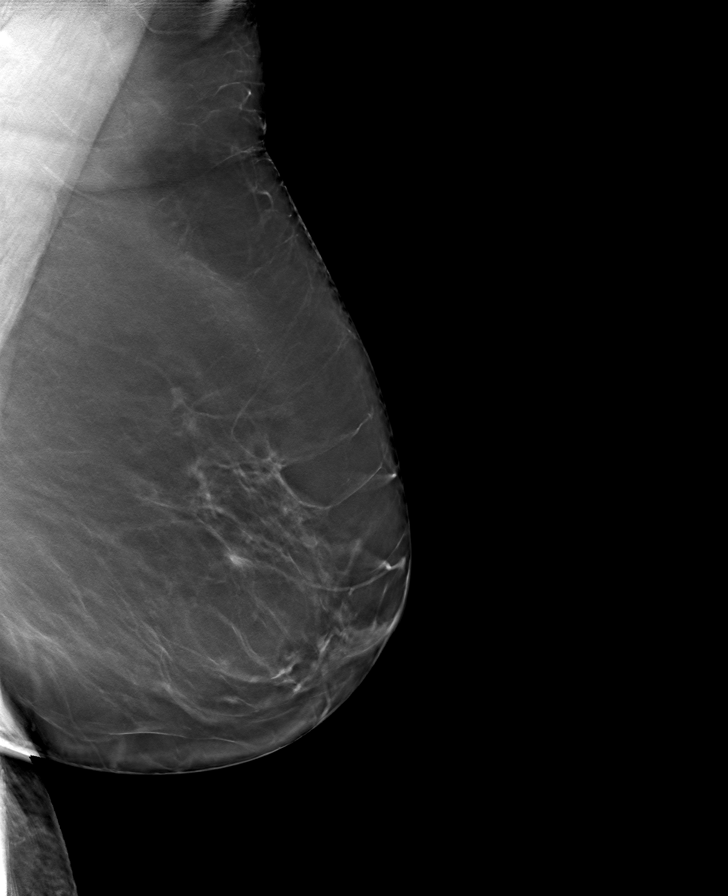

[8 of 24 positions shown; findings below may reference images not displayed]

ACR Breast Density Category b: There are scattered areas of
fibroglandular density.
FINDINGS: There are no findings suspicious for malignancy.
IMPRESSION: No mammographic evidence of malignancy. A result letter of this
screening mammogram will be mailed directly to the patient.

RECOMMENDATION:
Screening mammogram in one year. (Code:51-O-LD2)

BI-RADS CATEGORY  1: Negative.

## 2023-01-21 ENCOUNTER — Other Ambulatory Visit: Payer: Self-pay | Admitting: Internal Medicine

## 2023-01-21 DIAGNOSIS — K219 Gastro-esophageal reflux disease without esophagitis: Secondary | ICD-10-CM

## 2023-03-02 ENCOUNTER — Encounter: Payer: Self-pay | Admitting: Internal Medicine

## 2023-03-02 ENCOUNTER — Ambulatory Visit (INDEPENDENT_AMBULATORY_CARE_PROVIDER_SITE_OTHER): Payer: Medicare HMO | Admitting: Internal Medicine

## 2023-03-02 VITALS — BP 136/82 | HR 69 | Temp 98.2°F | Resp 16 | Ht 65.0 in | Wt 167.0 lb

## 2023-03-02 DIAGNOSIS — Z0000 Encounter for general adult medical examination without abnormal findings: Secondary | ICD-10-CM | POA: Diagnosis not present

## 2023-03-02 DIAGNOSIS — K219 Gastro-esophageal reflux disease without esophagitis: Secondary | ICD-10-CM | POA: Diagnosis not present

## 2023-03-02 DIAGNOSIS — Z1231 Encounter for screening mammogram for malignant neoplasm of breast: Secondary | ICD-10-CM

## 2023-03-02 DIAGNOSIS — I10 Essential (primary) hypertension: Secondary | ICD-10-CM | POA: Diagnosis not present

## 2023-03-02 DIAGNOSIS — D508 Other iron deficiency anemias: Secondary | ICD-10-CM

## 2023-03-02 DIAGNOSIS — E785 Hyperlipidemia, unspecified: Secondary | ICD-10-CM

## 2023-03-02 DIAGNOSIS — E559 Vitamin D deficiency, unspecified: Secondary | ICD-10-CM | POA: Diagnosis not present

## 2023-03-02 LAB — CBC WITH DIFFERENTIAL/PLATELET
Basophils Absolute: 0 10*3/uL (ref 0.0–0.1)
Basophils Relative: 0.5 % (ref 0.0–3.0)
Eosinophils Absolute: 0.1 10*3/uL (ref 0.0–0.7)
Eosinophils Relative: 2.7 % (ref 0.0–5.0)
HCT: 43.5 % (ref 36.0–46.0)
Hemoglobin: 14.4 g/dL (ref 12.0–15.0)
Lymphocytes Relative: 38.3 % (ref 12.0–46.0)
Lymphs Abs: 2 10*3/uL (ref 0.7–4.0)
MCHC: 33.1 g/dL (ref 30.0–36.0)
MCV: 84.8 fl (ref 78.0–100.0)
Monocytes Absolute: 0.5 10*3/uL (ref 0.1–1.0)
Monocytes Relative: 9.5 % (ref 3.0–12.0)
Neutro Abs: 2.5 10*3/uL (ref 1.4–7.7)
Neutrophils Relative %: 49 % (ref 43.0–77.0)
Platelets: 197 10*3/uL (ref 150.0–400.0)
RBC: 5.13 Mil/uL — ABNORMAL HIGH (ref 3.87–5.11)
RDW: 13.9 % (ref 11.5–15.5)
WBC: 5.2 10*3/uL (ref 4.0–10.5)

## 2023-03-02 LAB — HEPATIC FUNCTION PANEL
ALT: 15 U/L (ref 0–35)
AST: 19 U/L (ref 0–37)
Albumin: 4.1 g/dL (ref 3.5–5.2)
Alkaline Phosphatase: 79 U/L (ref 39–117)
Bilirubin, Direct: 0.1 mg/dL (ref 0.0–0.3)
Total Bilirubin: 0.6 mg/dL (ref 0.2–1.2)
Total Protein: 7.1 g/dL (ref 6.0–8.3)

## 2023-03-02 LAB — LIPID PANEL
Cholesterol: 246 mg/dL — ABNORMAL HIGH (ref 0–200)
HDL: 51.1 mg/dL (ref >39.00)
LDL Cholesterol: 170 mg/dL — ABNORMAL HIGH (ref 0–99)
NonHDL: 195.12
Total CHOL/HDL Ratio: 5
Triglycerides: 126 mg/dL (ref 0.0–149.0)
VLDL: 25.2 mg/dL (ref 0.0–40.0)

## 2023-03-02 LAB — BASIC METABOLIC PANEL
BUN: 16 mg/dL (ref 6–23)
CO2: 28 mEq/L (ref 19–32)
Calcium: 9.9 mg/dL (ref 8.4–10.5)
Chloride: 104 mEq/L (ref 96–112)
Creatinine, Ser: 0.87 mg/dL (ref 0.40–1.20)
GFR: 66.5 mL/min (ref >60.00)
Glucose, Bld: 92 mg/dL (ref 70–99)
Potassium: 4.4 mEq/L (ref 3.5–5.1)
Sodium: 141 mEq/L (ref 135–145)

## 2023-03-02 LAB — TSH: TSH: 3.6 u[IU]/mL (ref 0.35–5.50)

## 2023-03-02 LAB — VITAMIN D 25 HYDROXY (VIT D DEFICIENCY, FRACTURES): VITD: 67.4 ng/mL (ref 30.00–100.00)

## 2023-03-02 MED ORDER — OMEPRAZOLE 20 MG PO CPDR: 40.0000 mg | DELAYED_RELEASE_CAPSULE | Freq: Every day | ORAL | 0 refills | Status: DC

## 2023-03-02 MED ORDER — ROSUVASTATIN CALCIUM 20 MG PO TABS: 20.0000 mg | ORAL_TABLET | Freq: Every day | ORAL | 1 refills | Status: DC

## 2023-03-02 NOTE — Progress Notes (Unsigned)
Subjective:  Patient ID: Julia Olson, female    DOB: 08-13-1951  Age: 72 y.o. MRN: [id number]  CC: Annual Exam, Hyperlipidemia, and Gastroesophageal Reflux   HPI Julia Olson presents for a CPX and f/up ----  She walks 5 miles/day.  Outpatient Medications Prior to Visit  Medication Sig Dispense Refill   Cholecalciferol 50 MCG (2000 UT) TABS Take 2 tablets (4,000 Units total) by mouth daily. 180 tablet 1   cyanocobalamin 2000 MCG tablet Take 1 tablet (2,000 mcg total) by mouth daily. 90 tablet 3   ferrous sulfate 325 (65 FE) MG tablet Take 1 tablet (325 mg total) by mouth daily with breakfast. 90 tablet 1   Omega-3 Fatty Acids (OMEGA-3 FISH OIL PO) Take by mouth.     omeprazole (PRILOSEC) 20 MG capsule TAKE TWO CAPSULES BY MOUTH DAILY 180 capsule 0   No facility-administered medications prior to visit.    ROS Review of Systems  Objective:  BP 136/82 (BP Location: Left Arm, Patient Position: Sitting, Cuff Size: Large)   Pulse 69   Temp 98.2 F (36.8 C) (Oral)   Resp 16   Ht 5\' 5"  (1.651 m)   Wt 167 lb (75.8 kg)   SpO2 97%   BMI 27.79 kg/m   BP Readings from Last 3 Encounters:  03/02/23 136/82  02/04/22 128/78  10/24/21 110/60    Wt Readings from Last 3 Encounters:  03/02/23 167 lb (75.8 kg)  02/04/22 175 lb (79.4 kg)  10/24/21 180 lb (81.6 kg)    Physical Exam  Lab Results  Component Value Date   WBC 5.2 03/02/2023   HGB 14.4 03/02/2023   HCT 43.5 03/02/2023   PLT 197.0 03/02/2023   GLUCOSE 92 03/02/2023   CHOL 246 (H) 03/02/2023   TRIG 126.0 03/02/2023   HDL 51.10 03/02/2023   LDLDIRECT 189.8 11/26/2011   LDLCALC 170 (H) 03/02/2023   ALT 15 03/02/2023   AST 19 03/02/2023   NA 141 03/02/2023   K 4.4 03/02/2023   CL 104 03/02/2023   CREATININE 0.87 03/02/2023   BUN 16 03/02/2023   CO2 28 03/02/2023   TSH 3.60 03/02/2023    MM 3D SCREEN BREAST BILATERAL  Result Date: 01/26/2022 CLINICAL DATA:  Screening. EXAM: DIGITAL SCREENING  BILATERAL MAMMOGRAM WITH TOMOSYNTHESIS AND CAD TECHNIQUE: Bilateral screening digital craniocaudal and mediolateral oblique mammograms were obtained. Bilateral screening digital breast tomosynthesis was performed. The images were evaluated with computer-aided detection. COMPARISON:  Previous exam(s). ACR Breast Density Category b: There are scattered areas of fibroglandular density. FINDINGS: There are no findings suspicious for malignancy. IMPRESSION: No mammographic evidence of malignancy. A result letter of this screening mammogram will be mailed directly to the patient. RECOMMENDATION: Screening mammogram in one year. (Code:SM-B-01Y) BI-RADS CATEGORY  1: Negative. Electronically Signed   By: Abelardo Diesel M.D.   On: 01/26/2022 09:37    Assessment & Plan:  Hyperlipidemia with target LDL less than 130 -     Lipid panel; Future -     TSH; Future  Gastroesophageal reflux disease without esophagitis -     Omeprazole; Take 2 capsules (40 mg total) by mouth daily.  Dispense: 180 capsule; Refill: 0 -     CBC with Differential/Platelet; Future  Primary hypertension -     TSH; Future -     Hepatic function panel; Future -     Basic metabolic panel; Future  Other iron deficiency anemia  Vitamin D deficiency disease -     VITAMIN  D 25 Hydroxy (Vit-D Deficiency, Fractures); Future     Follow-up: Return in about 6 months (around 09/02/2023).  [name], MD

## 2023-03-02 NOTE — Patient Instructions (Signed)

## 2023-06-24 ENCOUNTER — Other Ambulatory Visit: Payer: Self-pay | Admitting: Internal Medicine

## 2023-06-24 DIAGNOSIS — K219 Gastro-esophageal reflux disease without esophagitis: Secondary | ICD-10-CM

## 2023-09-23 ENCOUNTER — Other Ambulatory Visit: Payer: Self-pay | Admitting: Internal Medicine

## 2023-09-23 DIAGNOSIS — K219 Gastro-esophageal reflux disease without esophagitis: Secondary | ICD-10-CM

## 2023-10-28 ENCOUNTER — Ambulatory Visit
Admission: RE | Admit: 2023-10-28 | Discharge: 2023-10-28 | Disposition: A | Discharge status: Home / Self Care | Payer: Medicare HMO | Source: Ambulatory Visit | Attending: Internal Medicine | Admitting: Internal Medicine

## 2023-10-28 ENCOUNTER — Other Ambulatory Visit: Payer: Self-pay | Admitting: Internal Medicine

## 2023-10-28 DIAGNOSIS — Z1231 Encounter for screening mammogram for malignant neoplasm of breast: Secondary | ICD-10-CM

## 2023-12-09 DIAGNOSIS — H524 Presbyopia: Secondary | ICD-10-CM | POA: Diagnosis not present

## 2023-12-09 DIAGNOSIS — H2513 Age-related nuclear cataract, bilateral: Secondary | ICD-10-CM | POA: Diagnosis not present

## 2023-12-09 DIAGNOSIS — Z135 Encounter for screening for eye and ear disorders: Secondary | ICD-10-CM | POA: Diagnosis not present

## 2023-12-09 DIAGNOSIS — H5213 Myopia, bilateral: Secondary | ICD-10-CM | POA: Diagnosis not present

## 2023-12-09 DIAGNOSIS — H52223 Regular astigmatism, bilateral: Secondary | ICD-10-CM | POA: Diagnosis not present

## 2023-12-21 ENCOUNTER — Other Ambulatory Visit: Payer: Self-pay | Admitting: Internal Medicine

## 2023-12-21 DIAGNOSIS — K219 Gastro-esophageal reflux disease without esophagitis: Secondary | ICD-10-CM

## 2024-01-08 DIAGNOSIS — Z0100 Encounter for examination of eyes and vision without abnormal findings: Secondary | ICD-10-CM | POA: Diagnosis not present

## 2024-01-10 DIAGNOSIS — D225 Melanocytic nevi of trunk: Secondary | ICD-10-CM | POA: Diagnosis not present

## 2024-01-10 DIAGNOSIS — L821 Other seborrheic keratosis: Secondary | ICD-10-CM | POA: Diagnosis not present

## 2024-01-10 DIAGNOSIS — D1801 Hemangioma of skin and subcutaneous tissue: Secondary | ICD-10-CM | POA: Diagnosis not present

## 2024-01-10 DIAGNOSIS — D2262 Melanocytic nevi of left upper limb, including shoulder: Secondary | ICD-10-CM | POA: Diagnosis not present

## 2024-01-10 DIAGNOSIS — D2272 Melanocytic nevi of left lower limb, including hip: Secondary | ICD-10-CM | POA: Diagnosis not present

## 2024-01-10 DIAGNOSIS — L814 Other melanin hyperpigmentation: Secondary | ICD-10-CM | POA: Diagnosis not present

## 2024-01-10 DIAGNOSIS — L820 Inflamed seborrheic keratosis: Secondary | ICD-10-CM | POA: Diagnosis not present

## 2024-01-10 DIAGNOSIS — D2239 Melanocytic nevi of other parts of face: Secondary | ICD-10-CM | POA: Diagnosis not present

## 2024-01-10 DIAGNOSIS — L570 Actinic keratosis: Secondary | ICD-10-CM | POA: Diagnosis not present

## 2024-01-10 DIAGNOSIS — D2222 Melanocytic nevi of left ear and external auricular canal: Secondary | ICD-10-CM | POA: Diagnosis not present

## 2024-01-10 DIAGNOSIS — L853 Xerosis cutis: Secondary | ICD-10-CM | POA: Diagnosis not present

## 2024-02-02 ENCOUNTER — Encounter: Payer: Medicare HMO | Admitting: Internal Medicine

## 2024-02-02 ENCOUNTER — Ambulatory Visit: Payer: Medicare HMO

## 2024-02-03 ENCOUNTER — Ambulatory Visit (INDEPENDENT_AMBULATORY_CARE_PROVIDER_SITE_OTHER): Payer: Medicare HMO

## 2024-02-03 VITALS — Ht 65.0 in | Wt 167.0 lb

## 2024-02-03 DIAGNOSIS — Z0000 Encounter for general adult medical examination without abnormal findings: Secondary | ICD-10-CM | POA: Diagnosis not present

## 2024-02-03 DIAGNOSIS — E639 Nutritional deficiency, unspecified: Secondary | ICD-10-CM

## 2024-02-03 DIAGNOSIS — Z1211 Encounter for screening for malignant neoplasm of colon: Secondary | ICD-10-CM | POA: Diagnosis not present

## 2024-02-03 NOTE — Patient Instructions (Addendum)
 Julia Olson , Thank you for taking time to come for your Medicare Wellness Visit. I appreciate your ongoing commitment to your health goals. Please review the following plan we discussed and let me know if I can assist you in the future.   Referrals/Orders/Follow-Ups/Clinician Recommendations: Aim for 30 minutes of exercise or brisk walking, 6-8 glasses of water, and 5 servings of fruits and vegetables each day. Patient will obtain the Influenza vaccine report from pharmacy and upload MyChart.  Cologuard test ordered today.  Nutritionist referral in process per patient request to assist with eating healthy.  This is a list of the screening recommended for you and due dates:  Health Maintenance  Topic Date Due   Zoster (Shingles) Vaccine (1 of 2) Never done   Flu Shot  07/15/2023   COVID-19 Vaccine (3 - 2024-25 season) 08/15/2023   Cologuard (Stool DNA test)  10/29/2023   Medicare Annual Wellness Visit  02/02/2025   Mammogram  10/27/2025   DTaP/Tdap/Td vaccine (3 - Td or Tdap) 10/09/2030   Pneumonia Vaccine  Completed   DEXA scan (bone density measurement)  Completed   Hepatitis C Screening  Completed   HPV Vaccine  Aged Out    Advanced directives: (Provided) Advance directive discussed with you today. I have provided a copy for you to complete at home and have notarized. Once this is complete, please bring a copy in to our office so we can scan it into your chart.   Next Medicare Annual Wellness Visit scheduled for next year: Yes - 02/2025

## 2024-02-03 NOTE — Progress Notes (Signed)
 Subjective:    Subjective:   Julia Olson is a 73 y.o. who presents for a Medicare Wellness preventive visit.  Visit Complete: Virtual I connected with  Sheria Lang on 02/03/24 by a video and audio enabled telemedicine application and verified that I am speaking with the correct person using two identifiers.  Patient Location: Home  Provider Location: Office/Clinic  I discussed the limitations of evaluation and management by telemedicine. The patient expressed understanding and agreed to proceed.  Vital Signs: Because this visit was a virtual/telehealth visit, some criteria may be missing or patient reported. Any vitals not documented were not able to be obtained and vitals that have been documented are patient reported. AWV Questionnaire: No: Patient Medicare AWV questionnaire was not completed prior to this visit.  Cardiac Risk Factors include: advanced age (>68men, >32 women);hypertension;dyslipidemia     Objective:    Today's Vitals   02/03/24 1408  Weight: 167 lb (75.8 kg)  Height: 5\' 5"  (1.651 m)   Body mass index is 27.79 kg/m.     02/03/2024    2:07 PM 11/17/2016   12:48 PM 10/30/2015    8:49 AM  Advanced Directives  Does Patient Have a Medical Advance Directive? No Yes No;Yes  Type of Special educational needs teacher of [place];Living will Healthcare Power of [place];Living will  Does patient want to make changes to medical advance directive?   No - Patient declined  Copy of Healthcare Power of Attorney in Chart?  Yes Yes  Would patient like information on creating a medical advance directive? Yes (MAU/Ambulatory/Procedural Areas - Information given)      Current Medications (verified) Outpatient Encounter Medications as of 02/03/2024  Medication Sig   Cholecalciferol 50 MCG (2000 UT) TABS Take 2 tablets (4,000 Units total) by mouth daily.   cyanocobalamin 2000 MCG tablet Take 1 tablet (2,000 mcg total) by mouth daily.   ferrous sulfate 325  (65 FE) MG tablet Take 1 tablet (325 mg total) by mouth daily with breakfast.   FLUAD 0.5 ML injection Inject 0.5 mLs into the muscle once. [facility]   Omega-3 Fatty Acids (OMEGA-3 FISH OIL PO) Take by mouth.   omeprazole (PRILOSEC) 20 MG capsule TAKE TWO CAPSULES BY MOUTH DAILY   rosuvastatin (CRESTOR) 20 MG tablet Take 1 tablet (20 mg total) by mouth daily.   No facility-administered encounter medications on file as of 02/03/2024.    Allergies (verified) Patient has no known allergies.   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   OSTEOARTHRITIS 08/27/2010   Past Surgical History:  Procedure Laterality Date   ABDOMINAL HYSTERECTOMY     Family History  Problem Relation Age of Onset   Arthritis Maternal Aunt    Alcohol abuse Other    Arthritis Other    Diabetes Other        1st degree relative   Hyperlipidemia Other    Hypertension Other    Cancer Other        FH of Colon Cancer   Heart disease Other    Social History   Socioeconomic History   Marital status: Married    Spouse name: Not on file   Number of children: Not on file   Years of education: Not on file   Highest education level: Not on file  Occupational History   Occupation: Banking Operatioms  Tobacco Use   Smoking status: Never  Smokeless tobacco: Never  Substance and Sexual Activity   Alcohol use: No   Drug use: No   Sexual activity: Not Currently  Other Topics Concern   Not on file  Social History Narrative   Regular exercise-yes   Social Drivers of Health   Financial Resource Strain: Low Risk  (02/03/2024)   Overall Financial Resource Strain (CARDIA)    Difficulty of Paying Living Expenses: Not hard at all  Food Insecurity: No Food Insecurity (02/03/2024)   Hunger Vital Sign    Worried About Running Out of Food in the Last Year: Never true    Ran Out of Food in the Last Year: Never true   Transportation Needs: No Transportation Needs (02/03/2024)   PRAPARE - Administrator, Civil Service (Medical): No    Lack of Transportation (Non-Medical): No  Physical Activity: Insufficiently Active (02/03/2024)   Exercise Vital Sign    Days of Exercise per Week: 3 days    Minutes of Exercise per Session: 30 min  Stress: No Stress Concern Present (02/03/2024)   [employer] of Occupational Health - Occupational Stress Questionnaire    Feeling of Stress : Not at all  Social Connections: Moderately Isolated (02/03/2024)   Social Connection and Isolation Panel [NHANES]    Frequency of Communication with Friends and Family: More than three times a week    Frequency of Social Gatherings with Friends and Family: More than three times a week    Attends Religious Services: Never    Database administrator or Organizations: No    Attends Banker Meetings: Never    Marital Status: Married    Tobacco Counseling: Patient has never smoked.  Counseling given: Non-smoker    Clinical Intake: Completed 02/03/2024.  Pt requested a Nutritionist referral to assist w/diet and education.  Pre-visit preparation completed: Yes  Pain : No/denies pain     BMI - recorded: 27.79 Nutritional Status: BMI 25 -29 Overweight Nutritional Risks: None Diabetes: No  How often do you need to have someone help you when you read instructions, pamphlets, or other written materials from your doctor or pharmacy?: 1 - Never  Interpreter Needed?: No  Information entered by :: Hassell Halim, CMA   Activities of Daily Living 02/03/2024    02/03/2024    2:11 PM  In your present state of health, do you have any difficulty performing the following activities:  Hearing? 0  Vision? 0  Difficulty concentrating or making decisions? 0  Walking or climbing stairs? 0  Dressing or bathing? 0  Doing errands, shopping? 0  Preparing Food and eating ? N  Using the Toilet? N  In the past six  months, have you accidently leaked urine? N  Do you have problems with loss of bowel control? N  Managing your Medications? N  Managing your Finances? N  Housekeeping or managing your Housekeeping? N    Patient Care Team: Etta Grandchild, MD as PCP - General  Indicate any recent Medical Services you may have received from other than [facility] providers in the past year (date may be approximate).     Assessment:   This is a routine wellness examination for Julia Olson.  Hearing/Vision screen Hearing Screening - Comments:: Denies hearing difficulties   Vision Screening - Comments:: Wears rx glasses - up to date with routine eye exams with  Dr Harriette Bouillon   Goals Addressed               This Visit's Progress  Weight (lb) < 200 lb (90.7 kg) (pt-stated)   167 lb (75.8 kg)     Patient stated that she plans to stay active by exercising and losing weight (15-20lbs).       Depression Screen Completed 02/03/2024    02/03/2024    2:16 PM 03/02/2023    8:00 AM 10/24/2021    1:58 PM 10/09/2020    8:19 AM 01/12/2019    4:41 PM 01/11/2019   10:11 AM 11/17/2017    7:59 AM  PHQ 2/9 Scores  PHQ - 2 Score 0 0 0 0 0 0 0  PHQ- 9 Score  1         Fall Risk Completed 02/03/2024    02/03/2024    2:12 PM 03/02/2023    8:00 AM 10/24/2021    1:58 PM 10/09/2020    8:19 AM 01/12/2019    4:42 PM  Fall Risk   Falls in the past year? 0 0 0 0 0  Number falls in past yr: 0 0 0    Injury with Fall? 0 0 0    Risk for fall due to : No Fall Risks No Fall Risks     Follow up Falls prevention discussed;Falls evaluation completed Falls evaluation completed       MEDICARE RISK AT HOME: Completed 2/20/025 Medicare Risk at Home Any stairs in or around the home?: No If so, are there any without handrails?: No Home free of loose throw rugs in walkways, pet beds, electrical cords, etc?: Yes Adequate lighting in your home to reduce risk of falls?: Yes Life alert?: No Use of a cane, walker or w/c?: No Grab bars  in the bathroom?: Yes Shower chair or bench in shower?: Yes Elevated toilet seat or a handicapped toilet?: No  TIMED UP AND GO:  Was the test performed?  Yes  Cognitive Function: 6CIT completed        02/03/2024    2:13 PM  6CIT Screen  What Year? 0 points  What month? 0 points  What time? 0 points  Count back from 20 0 points  Months in reverse 0 points  Repeat phrase 0 points  Total Score 0 points    Immunizations Immunization History  Administered Date(s) Administered   Fluad Quad(high Dose 65+) 10/09/2020   Influenza Inj Mdck Quad With Preservative 10/31/2022   Influenza Split 11/26/2011, 09/26/2012, 09/20/2014   Influenza, High Dose Seasonal PF 10/18/2021   Influenza,inj,Quad PF,6+ Mos 09/21/2016, 09/11/2017, 09/20/2018   Influenza-Unspecified 09/06/2013, 09/14/2013, 10/09/2015   Moderna Sars-Covid-2 Vaccination 02/08/2020, 03/07/2020   Pneumococcal Conjugate-13 11/10/2016   Pneumococcal Polysaccharide-23 11/16/2017   Td 08/27/2010   Tdap 10/09/2020    Screening Tests Health Maintenance  Topic Date Due   Zoster Vaccines- Shingrix (1 of 2) Never done   INFLUENZA VACCINE  07/15/2023   COVID-19 Vaccine (3 - 2024-25 season) 08/15/2023   Fecal DNA (Cologuard)  10/29/2023   Medicare Annual Wellness (AWV)  02/02/2025   MAMMOGRAM  10/27/2025   DTaP/Tdap/Td (3 - Td or Tdap) 10/09/2030   Pneumonia Vaccine 41+ Years old  Completed   DEXA SCAN  Completed   Hepatitis C Screening  Completed   HPV VACCINES  Aged Out    Health Maintenance  Health Maintenance Due  Topic Date Due   Zoster Vaccines- Shingrix (1 of 2) Never done   INFLUENZA VACCINE  07/15/2023   COVID-19 Vaccine (3 - 2024-25 season) 08/15/2023   Fecal DNA (Cologuard)  10/29/2023   Health Maintenance Items  Addressed: Patient stated received Influenza vaccine at local pharmacy in 2024.  Will obtain the report.  Patient declined the Shingles and COVID vaccines.  Cologuard test ordered on 02/03/2024.     Additional Screening:  Vision Screening: Recommended annual ophthalmology exams for early detection of glaucoma and other disorders of the eye. Eye exam w/Dr McFarland in 2024.  Dental Screening: Recommended annual dental exams for proper oral hygiene  Community Resource Referral / Chronic Care Management: CRR required this visit?  No   CCM required this visit?  No     Plan:     I have personally reviewed and noted the following in the patient's chart:   Medical and social history Use of alcohol, tobacco or illicit drugs  Current medications and supplements including opioid prescriptions. Patient is not currently taking opioid prescriptions. Functional ability and status Nutritional status Physical activity Advanced directives List of other physicians Hospitalizations, surgeries, and ER visits in previous 12 months Vitals Screenings to include cognitive, depression, and falls Referrals and appointments  In addition, I have reviewed and discussed with patient certain preventive protocols, quality metrics, and best practice recommendations. A written personalized care plan for preventive services as well as general preventive health recommendations were provided to patient.     Darreld Mclean, CMA   02/03/2024   After Visit Summary: (MyChart) Due to this being a telephonic visit, the after visit summary with patients personalized plan was offered to patient via MyChart   Notes: Please refer to Routing Comments.

## 2024-02-26 DIAGNOSIS — Z1211 Encounter for screening for malignant neoplasm of colon: Secondary | ICD-10-CM | POA: Diagnosis not present

## 2024-03-01 ENCOUNTER — Encounter: Payer: Self-pay | Admitting: Internal Medicine

## 2024-03-01 ENCOUNTER — Ambulatory Visit: Payer: Medicare HMO | Admitting: Internal Medicine

## 2024-03-01 VITALS — BP 132/86 | HR 72 | Temp 97.6°F | Resp 16 | Ht 65.0 in | Wt 162.0 lb

## 2024-03-01 DIAGNOSIS — E785 Hyperlipidemia, unspecified: Secondary | ICD-10-CM

## 2024-03-01 DIAGNOSIS — E559 Vitamin D deficiency, unspecified: Secondary | ICD-10-CM

## 2024-03-01 DIAGNOSIS — Z23 Encounter for immunization: Secondary | ICD-10-CM

## 2024-03-01 DIAGNOSIS — K219 Gastro-esophageal reflux disease without esophagitis: Secondary | ICD-10-CM

## 2024-03-01 DIAGNOSIS — Z0001 Encounter for general adult medical examination with abnormal findings: Secondary | ICD-10-CM

## 2024-03-01 DIAGNOSIS — I10 Essential (primary) hypertension: Secondary | ICD-10-CM

## 2024-03-01 DIAGNOSIS — Z0000 Encounter for general adult medical examination without abnormal findings: Secondary | ICD-10-CM

## 2024-03-01 NOTE — Progress Notes (Unsigned)
 Subjective:  Patient ID: Julia Olson, female    DOB: July 27, 1951  Age: 73 y.o. MRN: [id number]  CC: Hypertension, Gastroesophageal Reflux, Annual Exam, and Hyperlipidemia   HPI Julia Olson presents for a CPX and f/up ----  Discussed the use of AI scribe software for clinical note transcription with the patient, who gave verbal consent to proceed.  History of Present Illness   Julia Olson is a 73 year old female who presents for a routine follow-up visit.  She has a history of elevated blood pressure readings during office visits, which she attributes to white coat syndrome. Her blood pressure today was 154/70, higher than her usual home readings of approximately 127/80. No symptoms such as chest pain, shortness of breath, headache, or dizziness related to her blood pressure.  She maintains an active lifestyle, regularly using a stationary bike for exercise. No chest pain, shortness of breath, headache, blurred vision, dizziness, or lightheadedness during physical activity. She is actively trying to lose weight and is in the process of setting up a nutrition plan to improve her eating habits.  She received a flu shot in November of the previous year and has recently completed a Cologuard test, which she sent in on Sunday. No issues with heartburn, indigestion, or asthma.       Outpatient Medications Prior to Visit  Medication Sig Dispense Refill   Cholecalciferol 50 MCG (2000 UT) TABS Take 2 tablets (4,000 Units total) by mouth daily. 180 tablet 1   cyanocobalamin 2000 MCG tablet Take 1 tablet (2,000 mcg total) by mouth daily. 90 tablet 3   ferrous sulfate 325 (65 FE) MG tablet Take 1 tablet (325 mg total) by mouth daily with breakfast. 90 tablet 1   Omega-3 Fatty Acids (OMEGA-3 FISH OIL PO) Take by mouth.     omeprazole (PRILOSEC) 20 MG capsule TAKE TWO CAPSULES BY MOUTH DAILY 180 capsule 0   FLUAD 0.5 ML injection Inject 0.5 mLs into the muscle once. [facility]      rosuvastatin (CRESTOR) 20 MG tablet Take 1 tablet (20 mg total) by mouth daily. (Patient not taking: Reported on 03/01/2024) 90 tablet 1   No facility-administered medications prior to visit.    ROS Review of Systems  Constitutional: Negative.  Negative for appetite change, diaphoresis, fatigue and unexpected weight change.  HENT: Negative.    Eyes: Negative.   Respiratory: Negative.  Negative for cough, chest tightness, shortness of breath and wheezing.   Cardiovascular:  Negative for chest pain, palpitations and leg swelling.  Gastrointestinal: Negative.  Negative for abdominal pain, constipation, diarrhea, nausea and vomiting.  Endocrine: Negative.   Genitourinary:  Negative for difficulty urinating, frequency and vaginal pain.  Musculoskeletal:  Negative for arthralgias and myalgias.  Skin: Negative.   Hematological:  Negative for adenopathy. Does not bruise/bleed easily.  Psychiatric/Behavioral: Negative.      Objective:  BP 132/86 (BP Location: Left Arm, Patient Position: Sitting)   Pulse 72   Temp 97.6 F (36.4 C) (Temporal)   Resp 16   Ht 5\' 5"  (1.651 m)   Wt 162 lb (73.5 kg)   SpO2 96%   BMI 26.96 kg/m   BP Readings from Last 3 Encounters:  03/01/24 132/86  03/02/23 136/82  02/04/22 128/78    Wt Readings from Last 3 Encounters:  03/01/24 162 lb (73.5 kg)  02/03/24 167 lb (75.8 kg)  03/02/23 167 lb (75.8 kg)    Physical Exam Vitals reviewed.  Constitutional:  Appearance: Normal appearance.  HENT:     Nose: Nose normal.     Mouth/Throat:     Mouth: Mucous membranes are moist.  Eyes:     General: No scleral icterus.    Conjunctiva/sclera: Conjunctivae normal.  Cardiovascular:     Rate and Rhythm: Normal rate and regular rhythm.     Heart sounds: No murmur heard.    No friction rub. No gallop.     Comments: EKG- NSR, 66 bpm ?LAE NS ST/T wave changes No LVH or Q waves Pulmonary:     Effort: Pulmonary effort is normal.     Breath sounds: No  stridor. No wheezing, rhonchi or rales.  Abdominal:     General: Abdomen is flat.     Palpations: There is no mass.     Tenderness: There is no abdominal tenderness. There is no guarding.     Hernia: No hernia is present.  Musculoskeletal:        General: Normal range of motion.     Cervical back: Neck supple.     Right lower leg: No edema.     Left lower leg: No edema.  Lymphadenopathy:     Cervical: No cervical adenopathy.  Skin:    General: Skin is warm and dry.     Findings: No rash.  Neurological:     General: No focal deficit present.     Mental Status: She is alert. Mental status is at baseline.  Psychiatric:        Mood and Affect: Mood normal.        Behavior: Behavior normal.     Lab Results  Component Value Date   WBC 6.8 03/01/2024   HGB 14.0 03/01/2024   HCT 42.9 03/01/2024   PLT 212.0 03/01/2024   GLUCOSE 75 03/01/2024   CHOL 264 (H) 03/01/2024   TRIG 82.0 03/01/2024   HDL 56.20 03/01/2024   LDLDIRECT 189.8 11/26/2011   LDLCALC 192 (H) 03/01/2024   ALT 17 03/01/2024   AST 20 03/01/2024   NA 139 03/01/2024   K 4.2 03/01/2024   CL 102 03/01/2024   CREATININE 0.83 03/01/2024   BUN 19 03/01/2024   CO2 31 03/01/2024   TSH 2.24 03/01/2024    MM 3D SCREENING MAMMOGRAM BILATERAL BREAST Result Date: 11/01/2023 CLINICAL DATA:  Screening. EXAM: DIGITAL SCREENING BILATERAL MAMMOGRAM WITH TOMOSYNTHESIS AND CAD TECHNIQUE: Bilateral screening digital craniocaudal and mediolateral oblique mammograms were obtained. Bilateral screening digital breast tomosynthesis was performed. The images were evaluated with computer-aided detection. COMPARISON:  Previous exam(s). ACR Breast Density Category b: There are scattered areas of fibroglandular density. FINDINGS: There are no findings suspicious for malignancy. IMPRESSION: No mammographic evidence of malignancy. A result letter of this screening mammogram will be mailed directly to the patient. RECOMMENDATION: Screening  mammogram in one year. (Code:SM-B-01Y) BI-RADS CATEGORY  1: Negative. Electronically Signed   By: Frederico Hamman M.D.   On: 11/01/2023 14:04    Assessment & Plan:  Need for shingles vaccine  Gastroesophageal reflux disease without esophagitis -     CBC with Differential/Platelet; Future -     Omeprazole; Take 2 capsules (40 mg total) by mouth daily.  Dispense: 180 capsule; Refill: 1  Encounter for general adult medical examination with abnormal findings- Exam completed, labs reviewed, vaccines reviewed, cancer screenings addressed, pt ed material was given.   Hyperlipidemia with target LDL less than 130- LDL is 578. Will start a statin. Will risk stratify with a coronary calcium score.  -  Lipid panel; Future -     Hepatic function panel; Future -     Rosuvastatin Calcium; Take 1 tablet (20 mg total) by mouth daily.  Dispense: 90 tablet; Refill: 1 -     CT CARDIAC SCORING ([facility] LOCATIONS ONLY); Future  Primary hypertension- Her BP is well controlled. EKG is negative for LVH. -     CBC with Differential/Platelet; Future -     Hepatic function panel; Future -     TSH; Future -     Urinalysis, Routine w reflex microscopic; Future -     Basic metabolic panel; Future -     EKG 12-Lead  Vitamin D deficiency disease -     VITAMIN D 25 Hydroxy (Vit-D Deficiency, Fractures); Future     Follow-up: Return in about 6 months (around 09/01/2024).  Sanda Linger, MD

## 2024-03-01 NOTE — Patient Instructions (Signed)

## 2024-03-02 ENCOUNTER — Telehealth: Payer: Self-pay

## 2024-03-02 LAB — HEPATIC FUNCTION PANEL
ALT: 17 U/L (ref 0–35)
AST: 20 U/L (ref 0–37)
Albumin: 4.3 g/dL (ref 3.5–5.2)
Alkaline Phosphatase: 68 U/L (ref 39–117)
Bilirubin, Direct: 0.1 mg/dL (ref 0.0–0.3)
Total Bilirubin: 0.6 mg/dL (ref 0.2–1.2)
Total Protein: 7.2 g/dL (ref 6.0–8.3)

## 2024-03-02 LAB — LIPID PANEL
Cholesterol: 264 mg/dL — ABNORMAL HIGH (ref 0–200)
HDL: 56.2 mg/dL (ref >39.00)
LDL Cholesterol: 192 mg/dL — ABNORMAL HIGH (ref 0–99)
NonHDL: 207.95
Total CHOL/HDL Ratio: 5
Triglycerides: 82 mg/dL (ref 0.0–149.0)
VLDL: 16.4 mg/dL (ref 0.0–40.0)

## 2024-03-02 LAB — CBC WITH DIFFERENTIAL/PLATELET
Basophils Absolute: 0.1 10*3/uL (ref 0.0–0.1)
Basophils Relative: 0.8 % (ref 0.0–3.0)
Eosinophils Absolute: 0.2 10*3/uL (ref 0.0–0.7)
Eosinophils Relative: 2.2 % (ref 0.0–5.0)
HCT: 42.9 % (ref 36.0–46.0)
Hemoglobin: 14 g/dL (ref 12.0–15.0)
Lymphocytes Relative: 33.2 % (ref 12.0–46.0)
Lymphs Abs: 2.3 10*3/uL (ref 0.7–4.0)
MCHC: 32.7 g/dL (ref 30.0–36.0)
MCV: 88.9 fl (ref 78.0–100.0)
Monocytes Absolute: 0.5 10*3/uL (ref 0.1–1.0)
Monocytes Relative: 8.1 % (ref 3.0–12.0)
Neutro Abs: 3.8 10*3/uL (ref 1.4–7.7)
Neutrophils Relative %: 55.7 % (ref 43.0–77.0)
Platelets: 212 10*3/uL (ref 150.0–400.0)
RBC: 4.82 Mil/uL (ref 3.87–5.11)
RDW: 13.5 % (ref 11.5–15.5)
WBC: 6.8 10*3/uL (ref 4.0–10.5)

## 2024-03-02 LAB — BASIC METABOLIC PANEL
BUN: 19 mg/dL (ref 6–23)
CO2: 31 meq/L (ref 19–32)
Calcium: 9.9 mg/dL (ref 8.4–10.5)
Chloride: 102 meq/L (ref 96–112)
Creatinine, Ser: 0.83 mg/dL (ref 0.40–1.20)
GFR: 69.88 mL/min (ref >60.00)
Glucose, Bld: 75 mg/dL (ref 70–99)
Potassium: 4.2 meq/L (ref 3.5–5.1)
Sodium: 139 meq/L (ref 135–145)

## 2024-03-02 LAB — URINALYSIS, ROUTINE W REFLEX MICROSCOPIC
Bilirubin Urine: NEGATIVE
Hgb urine dipstick: NEGATIVE
Leukocytes,Ua: NEGATIVE
Nitrite: NEGATIVE
RBC / HPF: NONE SEEN (ref 0–?)
Specific Gravity, Urine: <=1.005 — AB (ref 1.000–1.030)
Total Protein, Urine: NEGATIVE
Urine Glucose: NEGATIVE
Urobilinogen, UA: 0.2 (ref 0.0–1.0)
WBC, UA: NONE SEEN (ref 0–?)
pH: 6 (ref 5.0–8.0)

## 2024-03-02 LAB — VITAMIN D 25 HYDROXY (VIT D DEFICIENCY, FRACTURES): VITD: 78.11 ng/mL (ref 30.00–100.00)

## 2024-03-02 LAB — TSH: TSH: 2.24 u[IU]/mL (ref 0.35–5.50)

## 2024-03-02 MED ORDER — ROSUVASTATIN CALCIUM 20 MG PO TABS: 20.0000 mg | ORAL_TABLET | Freq: Every day | ORAL | 1 refills | Status: DC

## 2024-03-02 NOTE — Telephone Encounter (Signed)
 Julia Olson you change the order?

## 2024-03-02 NOTE — Telephone Encounter (Signed)
 Copied from CRM [id number]. Topic: General - Other >> Mar 01, 2024  4:28 PM Fredrich Romans wrote: Reason for CRM: [facility] called in regarding order that was put in through epic,stating that order should be [facility] option in order for them to get the patient schedule. The self pay option was selected.

## 2024-03-03 ENCOUNTER — Encounter: Payer: Self-pay | Admitting: Internal Medicine

## 2024-03-03 LAB — COLOGUARD: COLOGUARD: NEGATIVE

## 2024-03-03 MED ORDER — SHINGRIX 50 MCG/0.5ML IM SUSR: 0.5000 mL | Freq: Once | INTRAMUSCULAR | 1 refills | Status: AC

## 2024-03-03 MED ORDER — OMEPRAZOLE 20 MG PO CPDR: 40.0000 mg | DELAYED_RELEASE_CAPSULE | Freq: Every day | ORAL | 1 refills | Status: DC

## 2024-03-06 ENCOUNTER — Other Ambulatory Visit: Payer: Self-pay | Admitting: Internal Medicine

## 2024-03-06 DIAGNOSIS — E785 Hyperlipidemia, unspecified: Secondary | ICD-10-CM

## 2024-03-06 MED ORDER — EZETIMIBE 10 MG PO TABS: 10.0000 mg | ORAL_TABLET | Freq: Every day | ORAL | 1 refills | Status: DC

## 2024-03-22 ENCOUNTER — Ambulatory Visit
Admission: RE | Admit: 2024-03-22 | Discharge: 2024-03-22 | Disposition: A | Discharge status: Home / Self Care | Source: Ambulatory Visit | Attending: Internal Medicine | Admitting: Internal Medicine

## 2024-03-22 DIAGNOSIS — E785 Hyperlipidemia, unspecified: Secondary | ICD-10-CM

## 2024-03-26 ENCOUNTER — Encounter: Payer: Self-pay | Admitting: Internal Medicine

## 2024-05-18 ENCOUNTER — Ambulatory Visit: Admitting: Family Medicine

## 2024-05-18 ENCOUNTER — Encounter: Payer: Self-pay | Admitting: Family Medicine

## 2024-05-18 VITALS — BP 134/80 | HR 75 | Temp 97.5°F | Ht 65.0 in | Wt 165.0 lb

## 2024-05-18 DIAGNOSIS — B9689 Other specified bacterial agents as the cause of diseases classified elsewhere: Secondary | ICD-10-CM

## 2024-05-18 DIAGNOSIS — R051 Acute cough: Secondary | ICD-10-CM

## 2024-05-18 DIAGNOSIS — J329 Chronic sinusitis, unspecified: Secondary | ICD-10-CM

## 2024-05-18 MED ORDER — AZITHROMYCIN 250 MG PO TABS: ORAL_TABLET | ORAL | 0 refills | Status: AC

## 2024-05-18 MED ORDER — HYDROCODONE BIT-HOMATROP MBR 5-1.5 MG/5ML PO SOLN: 5.0000 mL | Freq: Three times a day (TID) | ORAL | 0 refills | Status: AC | PRN

## 2024-05-18 NOTE — Patient Instructions (Addendum)
 I have sent in azithromycin for you to take.  Take 2 tablets today, then 1 tablet daily for the next 4 days.  I have sent in hydrocodone cough syrup for you to take 5 mL once daily in the evening as needed for cough.  This medication may make you sleepy.  Do not drive or operate heavy machinery while taking this medication.  Follow-up with me for new or worsening symptoms.

## 2024-05-18 NOTE — Progress Notes (Signed)
 Acute Office Visit  Subjective:     Patient ID: Julia Olson, female    DOB: 1951-06-22, 73 y.o.   MRN: [id number]  Chief Complaint  Patient presents with   Sore Throat    about 2 weeks    Cough    Sore Throat  Associated symptoms include coughing.  Cough   Patient is in today for evaluation of cough, fatigue, headaches, nasal congestion for the last 2 weeks. Cough is worse at night. Has tried Coricidin, Mucinex. Denies known sick contacts. Denies abdominal pain, nausea, vomiting, diarrhea, rash, fever, chills, other symptoms.  Medical hx as outlined below.  Review of Systems  Respiratory:  Positive for cough.    Per HPI      Objective:    BP 134/80   Pulse 75   Temp (!) 97.5 F (36.4 C)   Ht 5\' 5"  (1.651 m)   Wt 165 lb (74.8 kg)   SpO2 98%   BMI 27.46 kg/m    Physical Exam Vitals and nursing note reviewed.  Constitutional:      General: She is not in acute distress.    Comments: Appears fatigued  HENT:     Head: Normocephalic and atraumatic.     Right Ear: External ear normal.     Left Ear: External ear normal.     Nose: No congestion.     Right Sinus: Maxillary sinus tenderness and frontal sinus tenderness present.     Left Sinus: Maxillary sinus tenderness and frontal sinus tenderness present.     Mouth/Throat:     Mouth: Mucous membranes are moist.     Pharynx: Oropharynx is clear. No oropharyngeal exudate or posterior oropharyngeal erythema.     Comments: Oropharyngeal cobblestoning   Eyes:     Extraocular Movements: Extraocular movements intact.  Cardiovascular:     Rate and Rhythm: Normal rate and regular rhythm.     Heart sounds: Normal heart sounds.  Pulmonary:     Effort: Pulmonary effort is normal. No respiratory distress.     Breath sounds: No wheezing, rhonchi or rales.  Musculoskeletal:     Cervical back: Normal range of motion and neck supple.  Lymphadenopathy:     Cervical: No cervical adenopathy.  Skin:    General:  Skin is warm and dry.  Neurological:     General: No focal deficit present.     Mental Status: She is alert and oriented to person, place, and time.     No results found for any visits on 05/18/24.      Assessment & Plan:   Bacterial sinusitis -     Azithromycin; Take 2 tablets on day 1, then 1 tablet daily on days 2 through 5  Dispense: 6 tablet; Refill: 0  Acute cough -     HYDROcodone Bit-Homatrop MBr; Take 5 mLs by mouth every 8 (eight) hours as needed for cough.  Dispense: 120 mL; Refill: 0     Meds ordered this encounter  Medications   azithromycin (ZITHROMAX) 250 MG tablet    Sig: Take 2 tablets on day 1, then 1 tablet daily on days 2 through 5    Dispense:  6 tablet    Refill:  0   HYDROcodone bit-homatropine (HYCODAN) 5-1.5 MG/5ML syrup    Sig: Take 5 mLs by mouth every 8 (eight) hours as needed for cough.    Dispense:  120 mL    Refill:  0    Return if symptoms worsen or fail  to improve.  Wellington Half, FNP

## 2024-08-23 ENCOUNTER — Other Ambulatory Visit: Payer: Self-pay | Admitting: Internal Medicine

## 2024-08-23 DIAGNOSIS — E785 Hyperlipidemia, unspecified: Secondary | ICD-10-CM

## 2024-10-04 ENCOUNTER — Other Ambulatory Visit: Payer: Self-pay | Admitting: Internal Medicine

## 2024-10-04 DIAGNOSIS — E785 Hyperlipidemia, unspecified: Secondary | ICD-10-CM

## 2024-10-04 DIAGNOSIS — K219 Gastro-esophageal reflux disease without esophagitis: Secondary | ICD-10-CM

## 2024-12-25 ENCOUNTER — Other Ambulatory Visit: Payer: Self-pay | Admitting: Internal Medicine

## 2024-12-25 DIAGNOSIS — K219 Gastro-esophageal reflux disease without esophagitis: Secondary | ICD-10-CM

## 2024-12-25 DIAGNOSIS — E785 Hyperlipidemia, unspecified: Secondary | ICD-10-CM

## 2024-12-26 ENCOUNTER — Ambulatory Visit: Admitting: Podiatry

## 2024-12-26 ENCOUNTER — Ambulatory Visit (INDEPENDENT_AMBULATORY_CARE_PROVIDER_SITE_OTHER)

## 2024-12-26 DIAGNOSIS — M25571 Pain in right ankle and joints of right foot: Secondary | ICD-10-CM

## 2024-12-26 DIAGNOSIS — M722 Plantar fascial fibromatosis: Secondary | ICD-10-CM

## 2024-12-26 DIAGNOSIS — M65971 Unspecified synovitis and tenosynovitis, right ankle and foot: Secondary | ICD-10-CM | POA: Diagnosis not present

## 2024-12-26 MED ORDER — TRIAMCINOLONE ACETONIDE 10 MG/ML IJ SUSP
10.0000 mg | Freq: Once | INTRAMUSCULAR | Status: AC
  Administered 2024-12-26: 10 mg

## 2024-12-26 NOTE — Progress Notes (Signed)
 Presents complaining of pain along the medial ankle and also in the midfoot.  Some pain along the arch on the right.  Does not follow any injury.  Has not noticed any redness swelling or ecchymosis.   Physical exam:  General appearance: Pleasant, and in no acute distress. AOx3.  Vascular: Pedal pulses: DP 2/4 bilaterally, PT 2/4 bilaterally.  Mild edema lower legs bilaterally. Capillary fill time  Immediate bilaterally  Neurological: Light touch intact feet bilaterally.  Normal Achilles reflex bilaterally.  No clonus or spasticity noted.  Tinel sign tarsal tunnel and porta pedis bilaterally  Dermatologic:   Skin normal temperature bilaterally.  Skin normal color, tone, and texture bilaterally.   Musculoskeletal: Tenderness along posterior tibial tendon along the medial malleolus down to the navicular tuberosity right.  No tenderness at the sinus tarsi.  Some tenderness along the medial edge of the plantar fascia.  Some tenderness at the plantar medial calcaneal tubercle right.  Radiographs: 3 views foot right: Notes any fractures or dislocations.  Some joint space narrowing at the 2nd and 3rd TMT's.  No normal bone density.  Diagnosis: 1.  Posterior tibial tenosynovitis right 2.  Arthralgia TMT's 1 through 3 right   Plan: -Established office visit for evaluation and management.  Level 3. -Discussed the posterior tibial tendon pain.  On the right.  Also discussed the arthralgia at the midfoot at the TMT's.  Recommended good stable supportive shoes.  Will get her some OTC orthotics.  RICE.  -injected 3cc 2:1 mixture 0.5 cc Marcaine: Triamcinolone  40mg /56ml along posterior tibial tendon sheath right   Return 2 weeks follow-up injection posterior tibial tendon sheath right

## 2025-01-09 ENCOUNTER — Ambulatory Visit: Admitting: Podiatry

## 2025-02-14 ENCOUNTER — Ambulatory Visit: Payer: Medicare HMO

## 2025-03-05 ENCOUNTER — Ambulatory Visit: Payer: Medicare HMO

## 2025-03-06 ENCOUNTER — Encounter: Admitting: Internal Medicine
